# Patient Record
Sex: Female | Born: 1940
Health system: Southern US, Community
[De-identification: ages and names within clinical notes are randomized; demographics above are authoritative.]

## PROBLEM LIST (undated history)

## (undated) ENCOUNTER — Emergency Department (HOSPITAL_BASED_OUTPATIENT_CLINIC_OR_DEPARTMENT_OTHER): Admission: EM | Source: Home / Self Care

## (undated) DIAGNOSIS — L03114 Cellulitis of left upper limb: Secondary | ICD-10-CM

## (undated) DIAGNOSIS — I890 Lymphedema, not elsewhere classified: Secondary | ICD-10-CM

## (undated) DIAGNOSIS — K4190 Unilateral femoral hernia, without obstruction or gangrene, not specified as recurrent: Secondary | ICD-10-CM

## (undated) DIAGNOSIS — R112 Nausea with vomiting, unspecified: Secondary | ICD-10-CM

## (undated) DIAGNOSIS — C50919 Malignant neoplasm of unspecified site of unspecified female breast: Secondary | ICD-10-CM

## (undated) DIAGNOSIS — H409 Unspecified glaucoma: Secondary | ICD-10-CM

## (undated) DIAGNOSIS — K219 Gastro-esophageal reflux disease without esophagitis: Secondary | ICD-10-CM

## (undated) DIAGNOSIS — Z9889 Other specified postprocedural states: Secondary | ICD-10-CM

## (undated) HISTORY — DX: Unspecified glaucoma: H40.9

## (undated) HISTORY — DX: Unilateral femoral hernia, without obstruction or gangrene, not specified as recurrent: K41.90

## (undated) HISTORY — DX: Lymphedema, not elsewhere classified: I89.0

## (undated) HISTORY — DX: Malignant neoplasm of unspecified site of unspecified female breast: C50.919

## (undated) HISTORY — DX: Gastro-esophageal reflux disease without esophagitis: K21.9

## (undated) HISTORY — DX: Cellulitis of left upper limb: L03.114

---

## 1978-02-07 HISTORY — PX: BUNIONECTOMY: SHX129

## 1990-02-07 DIAGNOSIS — K4190 Unilateral femoral hernia, without obstruction or gangrene, not specified as recurrent: Secondary | ICD-10-CM

## 1990-02-07 HISTORY — DX: Unilateral femoral hernia, without obstruction or gangrene, not specified as recurrent: K41.90

## 1990-02-07 HISTORY — PX: FEMORAL HERNIA REPAIR: SHX632

## 1997-07-11 ENCOUNTER — Other Ambulatory Visit: Admission: RE | Admit: 1997-07-11 | Discharge: 1997-07-11 | Payer: Self-pay | Admitting: *Deleted

## 1998-02-03 ENCOUNTER — Ambulatory Visit (HOSPITAL_COMMUNITY): Admission: RE | Admit: 1998-02-03 | Discharge: 1998-02-03 | Payer: Self-pay | Admitting: Geriatric Medicine

## 1998-07-10 ENCOUNTER — Other Ambulatory Visit: Admission: RE | Admit: 1998-07-10 | Discharge: 1998-07-10 | Payer: Self-pay | Admitting: *Deleted

## 1999-08-27 ENCOUNTER — Other Ambulatory Visit: Admission: RE | Admit: 1999-08-27 | Discharge: 1999-08-27 | Payer: Self-pay | Admitting: *Deleted

## 2000-09-28 ENCOUNTER — Other Ambulatory Visit: Admission: RE | Admit: 2000-09-28 | Discharge: 2000-09-28 | Payer: Self-pay | Admitting: *Deleted

## 2001-02-07 DIAGNOSIS — C50919 Malignant neoplasm of unspecified site of unspecified female breast: Secondary | ICD-10-CM

## 2001-02-07 HISTORY — DX: Malignant neoplasm of unspecified site of unspecified female breast: C50.919

## 2001-09-24 ENCOUNTER — Other Ambulatory Visit: Admission: RE | Admit: 2001-09-24 | Discharge: 2001-09-24 | Payer: Self-pay | Admitting: Radiology

## 2001-10-01 ENCOUNTER — Other Ambulatory Visit: Admission: RE | Admit: 2001-10-01 | Discharge: 2001-10-01 | Payer: Self-pay | Admitting: Obstetrics and Gynecology

## 2001-10-08 HISTORY — PX: BREAST LUMPECTOMY: SHX2

## 2001-10-09 ENCOUNTER — Encounter: Admission: RE | Admit: 2001-10-09 | Discharge: 2001-10-09 | Payer: Self-pay | Admitting: General Surgery

## 2001-10-09 ENCOUNTER — Encounter: Payer: Self-pay | Admitting: General Surgery

## 2001-10-10 ENCOUNTER — Encounter (INDEPENDENT_AMBULATORY_CARE_PROVIDER_SITE_OTHER): Payer: Self-pay | Admitting: *Deleted

## 2001-10-10 ENCOUNTER — Ambulatory Visit (HOSPITAL_BASED_OUTPATIENT_CLINIC_OR_DEPARTMENT_OTHER): Admission: RE | Admit: 2001-10-10 | Discharge: 2001-10-10 | Payer: Self-pay | Admitting: General Surgery

## 2001-10-10 ENCOUNTER — Encounter: Payer: Self-pay | Admitting: General Surgery

## 2001-10-18 ENCOUNTER — Encounter (INDEPENDENT_AMBULATORY_CARE_PROVIDER_SITE_OTHER): Payer: Self-pay | Admitting: *Deleted

## 2001-10-18 ENCOUNTER — Ambulatory Visit (HOSPITAL_BASED_OUTPATIENT_CLINIC_OR_DEPARTMENT_OTHER): Admission: RE | Admit: 2001-10-18 | Discharge: 2001-10-18 | Payer: Self-pay | Admitting: General Surgery

## 2001-10-18 HISTORY — PX: AXILLARY LYMPH NODE DISSECTION: SHX5229

## 2001-10-19 ENCOUNTER — Ambulatory Visit: Admission: RE | Admit: 2001-10-19 | Discharge: 2001-11-12 | Payer: Self-pay | Admitting: *Deleted

## 2001-11-01 ENCOUNTER — Ambulatory Visit (HOSPITAL_COMMUNITY): Admission: RE | Admit: 2001-11-01 | Discharge: 2001-11-01 | Payer: Self-pay | Admitting: Oncology

## 2001-11-01 ENCOUNTER — Encounter: Payer: Self-pay | Admitting: Oncology

## 2002-03-07 ENCOUNTER — Ambulatory Visit: Admission: RE | Admit: 2002-03-07 | Discharge: 2002-06-05 | Payer: Self-pay | Admitting: *Deleted

## 2002-06-28 ENCOUNTER — Ambulatory Visit: Admission: RE | Admit: 2002-06-28 | Discharge: 2002-06-28 | Payer: Self-pay | Admitting: *Deleted

## 2002-11-05 ENCOUNTER — Other Ambulatory Visit: Admission: RE | Admit: 2002-11-05 | Discharge: 2002-11-05 | Payer: Self-pay | Admitting: Obstetrics and Gynecology

## 2002-12-05 ENCOUNTER — Ambulatory Visit (HOSPITAL_COMMUNITY): Admission: RE | Admit: 2002-12-05 | Discharge: 2002-12-05 | Payer: Self-pay | Admitting: Gastroenterology

## 2003-11-28 ENCOUNTER — Other Ambulatory Visit: Admission: RE | Admit: 2003-11-28 | Discharge: 2003-11-28 | Payer: Self-pay | Admitting: Obstetrics and Gynecology

## 2004-03-10 ENCOUNTER — Ambulatory Visit: Payer: Self-pay | Admitting: Oncology

## 2004-07-06 ENCOUNTER — Ambulatory Visit: Payer: Self-pay | Admitting: Oncology

## 2004-11-30 ENCOUNTER — Other Ambulatory Visit: Admission: RE | Admit: 2004-11-30 | Discharge: 2004-11-30 | Payer: Self-pay | Admitting: Obstetrics and Gynecology

## 2005-01-07 ENCOUNTER — Ambulatory Visit: Payer: Self-pay | Admitting: Oncology

## 2005-07-05 ENCOUNTER — Ambulatory Visit: Payer: Self-pay | Admitting: Oncology

## 2005-07-11 LAB — CBC WITH DIFFERENTIAL/PLATELET
BASO%: 0.2 % (ref 0.0–2.0)
Basophils Absolute: 0 10*3/uL (ref 0.0–0.1)
EOS%: 0.2 % (ref 0.0–7.0)
MCH: 31.8 pg (ref 26.0–34.0)
MCHC: 34.3 g/dL (ref 32.0–36.0)
MCV: 92.6 fL (ref 81.0–101.0)
MONO%: 5.9 % (ref 0.0–13.0)
RBC: 4.2 10*6/uL (ref 3.70–5.32)
RDW: 12.6 % (ref 11.3–14.5)
lymph#: 1.1 10*3/uL (ref 0.9–3.3)

## 2005-07-11 LAB — COMPREHENSIVE METABOLIC PANEL
AST: 16 U/L (ref 0–37)
BUN: 16 mg/dL (ref 6–23)
Calcium: 8.7 mg/dL (ref 8.4–10.5)
Chloride: 105 mEq/L (ref 96–112)
Creatinine, Ser: 0.95 mg/dL (ref 0.40–1.20)
Total Bilirubin: 0.4 mg/dL (ref 0.3–1.2)

## 2005-07-11 LAB — CANCER ANTIGEN 27.29: CA 27.29: 12 U/mL (ref 0–39)

## 2005-07-11 LAB — MORPHOLOGY
PLT EST: ADEQUATE
RBC Comments: NORMAL

## 2005-07-28 ENCOUNTER — Ambulatory Visit (HOSPITAL_COMMUNITY): Admission: RE | Admit: 2005-07-28 | Discharge: 2005-07-28 | Payer: Self-pay | Admitting: Oncology

## 2005-11-04 ENCOUNTER — Other Ambulatory Visit: Admission: RE | Admit: 2005-11-04 | Discharge: 2005-11-04 | Payer: Self-pay | Admitting: Obstetrics & Gynecology

## 2006-01-05 ENCOUNTER — Ambulatory Visit: Payer: Self-pay | Admitting: Oncology

## 2006-01-10 LAB — CBC WITH DIFFERENTIAL/PLATELET
BASO%: 0.3 % (ref 0.0–2.0)
EOS%: 0.8 % (ref 0.0–7.0)
HCT: 38 % (ref 34.8–46.6)
HGB: 13.1 g/dL (ref 11.6–15.9)
MCH: 32 pg (ref 26.0–34.0)
MCHC: 34.5 g/dL (ref 32.0–36.0)
MONO#: 0.3 10*3/uL (ref 0.1–0.9)
NEUT%: 77.3 % — ABNORMAL HIGH (ref 39.6–76.8)
RDW: 12.1 % (ref 11.3–14.5)
WBC: 7.9 10*3/uL (ref 3.9–10.0)
lymph#: 1.4 10*3/uL (ref 0.9–3.3)

## 2006-01-10 LAB — COMPREHENSIVE METABOLIC PANEL
ALT: 12 U/L (ref 0–35)
AST: 11 U/L (ref 0–37)
Albumin: 4.1 g/dL (ref 3.5–5.2)
CO2: 27 mEq/L (ref 19–32)
Calcium: 9.2 mg/dL (ref 8.4–10.5)
Chloride: 105 mEq/L (ref 96–112)
Creatinine, Ser: 0.88 mg/dL (ref 0.40–1.20)
Potassium: 4.2 mEq/L (ref 3.5–5.3)
Sodium: 143 mEq/L (ref 135–145)
Total Protein: 6.3 g/dL (ref 6.0–8.3)

## 2006-01-10 LAB — CANCER ANTIGEN 27.29: CA 27.29: 4 U/mL (ref 0–39)

## 2006-01-10 LAB — LACTATE DEHYDROGENASE: LDH: 163 U/L (ref 94–250)

## 2006-04-08 DIAGNOSIS — I890 Lymphedema, not elsewhere classified: Secondary | ICD-10-CM

## 2006-04-08 HISTORY — DX: Lymphedema, not elsewhere classified: I89.0

## 2006-04-18 ENCOUNTER — Encounter: Admission: RE | Admit: 2006-04-18 | Discharge: 2006-06-13 | Payer: Self-pay | Admitting: General Surgery

## 2006-07-12 ENCOUNTER — Ambulatory Visit: Payer: Self-pay | Admitting: Oncology

## 2006-07-17 LAB — CBC WITH DIFFERENTIAL/PLATELET
BASO%: 0.5 % (ref 0.0–2.0)
Basophils Absolute: 0 10*3/uL (ref 0.0–0.1)
EOS%: 0.9 % (ref 0.0–7.0)
HCT: 37.6 % (ref 34.8–46.6)
HGB: 13.3 g/dL (ref 11.6–15.9)
MCHC: 35.5 g/dL (ref 32.0–36.0)
MONO#: 0.4 10*3/uL (ref 0.1–0.9)
NEUT%: 65 % (ref 39.6–76.8)
RDW: 12.4 % (ref 11.3–14.5)
WBC: 6 10*3/uL (ref 3.9–10.0)
lymph#: 1.6 10*3/uL (ref 0.9–3.3)

## 2006-07-17 LAB — COMPREHENSIVE METABOLIC PANEL
ALT: 15 U/L (ref 0–35)
AST: 14 U/L (ref 0–37)
Albumin: 4.3 g/dL (ref 3.5–5.2)
CO2: 27 mEq/L (ref 19–32)
Calcium: 9 mg/dL (ref 8.4–10.5)
Chloride: 106 mEq/L (ref 96–112)
Creatinine, Ser: 1.03 mg/dL (ref 0.40–1.20)
Potassium: 4.1 mEq/L (ref 3.5–5.3)

## 2006-07-17 LAB — LACTATE DEHYDROGENASE: LDH: 180 U/L (ref 94–250)

## 2006-10-27 ENCOUNTER — Encounter: Admission: RE | Admit: 2006-10-27 | Discharge: 2006-10-27 | Payer: Self-pay | Admitting: Oncology

## 2007-01-11 ENCOUNTER — Ambulatory Visit: Payer: Self-pay | Admitting: Oncology

## 2007-01-15 LAB — COMPREHENSIVE METABOLIC PANEL
Alkaline Phosphatase: 49 U/L (ref 39–117)
BUN: 17 mg/dL (ref 6–23)
CO2: 25 mEq/L (ref 19–32)
Creatinine, Ser: 0.95 mg/dL (ref 0.40–1.20)
Glucose, Bld: 91 mg/dL (ref 70–99)
Sodium: 142 mEq/L (ref 135–145)
Total Bilirubin: 0.4 mg/dL (ref 0.3–1.2)
Total Protein: 6.1 g/dL (ref 6.0–8.3)

## 2007-01-15 LAB — CBC WITH DIFFERENTIAL/PLATELET
BASO%: 0.4 % (ref 0.0–2.0)
EOS%: 1.8 % (ref 0.0–7.0)
LYMPH%: 25.6 % (ref 14.0–48.0)
MCH: 32.2 pg (ref 26.0–34.0)
MCHC: 35.2 g/dL (ref 32.0–36.0)
MCV: 91.5 fL (ref 81.0–101.0)
MONO%: 5.3 % (ref 0.0–13.0)
NEUT#: 4.1 10*3/uL (ref 1.5–6.5)
Platelets: 218 10*3/uL (ref 145–400)
RBC: 4.14 10*6/uL (ref 3.70–5.32)
RDW: 12.4 % (ref 11.3–14.5)

## 2007-01-15 LAB — LACTATE DEHYDROGENASE: LDH: 174 U/L (ref 94–250)

## 2007-01-15 LAB — CANCER ANTIGEN 27.29: CA 27.29: 11 U/mL (ref 0–39)

## 2007-05-09 ENCOUNTER — Encounter: Admission: RE | Admit: 2007-05-09 | Discharge: 2007-05-09 | Payer: Self-pay | Admitting: Oncology

## 2007-07-12 ENCOUNTER — Ambulatory Visit: Payer: Self-pay | Admitting: Oncology

## 2007-07-16 LAB — CBC WITH DIFFERENTIAL/PLATELET
Basophils Absolute: 0 10*3/uL (ref 0.0–0.1)
Eosinophils Absolute: 0 10*3/uL (ref 0.0–0.5)
HCT: 38.5 % (ref 34.8–46.6)
HGB: 13.4 g/dL (ref 11.6–15.9)
LYMPH%: 21.3 % (ref 14.0–48.0)
MCV: 91.4 fL (ref 81.0–101.0)
MONO%: 6.2 % (ref 0.0–13.0)
NEUT#: 4.5 10*3/uL (ref 1.5–6.5)
Platelets: 232 10*3/uL (ref 145–400)

## 2007-07-17 LAB — COMPREHENSIVE METABOLIC PANEL
Albumin: 3.9 g/dL (ref 3.5–5.2)
Alkaline Phosphatase: 47 U/L (ref 39–117)
BUN: 15 mg/dL (ref 6–23)
CO2: 26 mEq/L (ref 19–32)
Glucose, Bld: 93 mg/dL (ref 70–99)
Total Bilirubin: 0.4 mg/dL (ref 0.3–1.2)

## 2007-07-17 LAB — VITAMIN D 25 HYDROXY (VIT D DEFICIENCY, FRACTURES): Vit D, 25-Hydroxy: 37 ng/mL (ref 30–89)

## 2007-07-17 LAB — CANCER ANTIGEN 27.29: CA 27.29: 12 U/mL (ref 0–39)

## 2008-01-01 ENCOUNTER — Other Ambulatory Visit: Admission: RE | Admit: 2008-01-01 | Discharge: 2008-01-01 | Payer: Self-pay | Admitting: Obstetrics and Gynecology

## 2008-01-17 ENCOUNTER — Ambulatory Visit: Payer: Self-pay | Admitting: Oncology

## 2008-01-21 LAB — CBC WITH DIFFERENTIAL/PLATELET
BASO%: 0.4 % (ref 0.0–2.0)
EOS%: 0.6 % (ref 0.0–7.0)
Eosinophils Absolute: 0 10*3/uL (ref 0.0–0.5)
LYMPH%: 17.7 % (ref 14.0–48.0)
MCHC: 34.3 g/dL (ref 32.0–36.0)
MCV: 92.7 fL (ref 81.0–101.0)
MONO%: 5.2 % (ref 0.0–13.0)
NEUT#: 4.8 10*3/uL (ref 1.5–6.5)
RBC: 4.11 10*6/uL (ref 3.70–5.32)
RDW: 12.6 % (ref 11.3–14.5)
WBC: 6.4 10*3/uL (ref 3.9–10.0)

## 2008-01-22 LAB — COMPREHENSIVE METABOLIC PANEL
ALT: 12 U/L (ref 0–35)
AST: 12 U/L (ref 0–37)
Alkaline Phosphatase: 47 U/L (ref 39–117)
CO2: 24 mEq/L (ref 19–32)
Sodium: 142 mEq/L (ref 135–145)
Total Bilirubin: 0.4 mg/dL (ref 0.3–1.2)
Total Protein: 6.3 g/dL (ref 6.0–8.3)

## 2009-01-16 ENCOUNTER — Ambulatory Visit: Payer: Self-pay | Admitting: Oncology

## 2009-01-20 LAB — COMPREHENSIVE METABOLIC PANEL
CO2: 28 mEq/L (ref 19–32)
Calcium: 8.9 mg/dL (ref 8.4–10.5)
Chloride: 105 mEq/L (ref 96–112)
Creatinine, Ser: 0.89 mg/dL (ref 0.40–1.20)
Glucose, Bld: 79 mg/dL (ref 70–99)
Total Bilirubin: 0.4 mg/dL (ref 0.3–1.2)
Total Protein: 6 g/dL (ref 6.0–8.3)

## 2009-01-20 LAB — LACTATE DEHYDROGENASE: LDH: 146 U/L (ref 94–250)

## 2009-01-20 LAB — CBC WITH DIFFERENTIAL/PLATELET
Eosinophils Absolute: 0.1 10*3/uL (ref 0.0–0.5)
HCT: 38.7 % (ref 34.8–46.6)
HGB: 13.3 g/dL (ref 11.6–15.9)
LYMPH%: 25.5 % (ref 14.0–49.7)
MONO#: 0.3 10*3/uL (ref 0.1–0.9)
NEUT#: 2.8 10*3/uL (ref 1.5–6.5)
NEUT%: 64.8 % (ref 38.4–76.8)
Platelets: 201 10*3/uL (ref 145–400)
WBC: 4.4 10*3/uL (ref 3.9–10.3)

## 2009-01-20 LAB — CANCER ANTIGEN 27.29: CA 27.29: 5 U/mL (ref 0–39)

## 2010-01-14 ENCOUNTER — Ambulatory Visit: Payer: Self-pay | Admitting: Oncology

## 2010-01-18 LAB — CBC WITH DIFFERENTIAL/PLATELET
Basophils Absolute: 0 10*3/uL (ref 0.0–0.1)
Eosinophils Absolute: 0.1 10*3/uL (ref 0.0–0.5)
HCT: 38.8 % (ref 34.8–46.6)
HGB: 13.5 g/dL (ref 11.6–15.9)
LYMPH%: 21.8 % (ref 14.0–49.7)
MCV: 93.7 fL (ref 79.5–101.0)
MONO%: 6 % (ref 0.0–14.0)
NEUT#: 4.4 10*3/uL (ref 1.5–6.5)
NEUT%: 69.8 % (ref 38.4–76.8)
Platelets: 218 10*3/uL (ref 145–400)

## 2010-01-19 LAB — COMPREHENSIVE METABOLIC PANEL
Albumin: 4.1 g/dL (ref 3.5–5.2)
Alkaline Phosphatase: 57 U/L (ref 39–117)
BUN: 15 mg/dL (ref 6–23)
Glucose, Bld: 75 mg/dL (ref 70–99)
Potassium: 3.9 mEq/L (ref 3.5–5.3)

## 2010-01-19 LAB — VITAMIN D 25 HYDROXY (VIT D DEFICIENCY, FRACTURES): Vit D, 25-Hydroxy: 42 ng/mL (ref 30–89)

## 2010-01-19 LAB — CANCER ANTIGEN 27.29: CA 27.29: 11 U/mL (ref 0–39)

## 2010-02-28 ENCOUNTER — Encounter: Payer: Self-pay | Admitting: Oncology

## 2010-03-01 ENCOUNTER — Encounter: Payer: Self-pay | Admitting: Oncology

## 2010-06-25 NOTE — Op Note (Signed)
   NAMEERMAL, BRZOZOWSKI                           ACCOUNT NO.:  [id number]   MEDICAL RECORD NO.:  [id number]                   PATIENT TYPE:  AMB   LOCATION:  ENDO                                 FACILITY:  [facility]   PHYSICIAN:  John C. Madilyn Fireman, M.D.                 DATE OF BIRTH:  Jan 12, 1941   DATE OF PROCEDURE:  12/05/2002  DATE OF DISCHARGE:                                 OPERATIVE REPORT   PROCEDURE:  Colonoscopy.   INDICATION FOR PROCEDURE:  Personal history of breast cancer in a 70-year-  old patient with no recent colon screening.   DESCRIPTION OF PROCEDURE:  The patient was placed in the left lateral  decubitus position and placed on the pulse monitor with continuous low-flow  oxygen delivered by nasal cannula.  She was sedated with 75 mcg of IV  fentanyl and 6 mg IV Versed.  The Olympus video colonoscope was inserted  into the rectum and advanced to the cecum, confirmed by transillumination of  McBurney's point and visualization of the ileocecal valve and appendiceal  orifice.  The prep was excellent.  The cecum, ascending, transverse, and  descending colon all appeared normal with no masses, polyps, diverticula, or  other mucosal abnormalities.  Within the sigmoid colon there were seen a few  scattered diverticula, no other abnormalities.  The rectum appeared normal,  and retroflexed view of the anus revealed no obvious internal hemorrhoids.  The scope was then withdrawn and the patient returned to the recovery room  in stable condition.  She tolerated the procedure well and there were no  immediate complications.   IMPRESSION:  Diverticulosis, otherwise normal colonoscopy.   PLAN:  Repeat study in five years based on her history of breast cancer.                                               John C. Madilyn Fireman, M.D.    JCH/MEDQ  D:  12/05/2002  T:  12/05/2002  Job:  [id number]   cc:   Hal T. Stoneking, M.D.  [address] [place], [place] [address]  Fax: [phone]

## 2010-06-25 NOTE — Op Note (Signed)
NAME:  Megan Graham, EBERS                           ACCOUNT NO.:  [id number]   MEDICAL RECORD NO.:  [id number]                   PATIENT TYPE:  AMB   LOCATION:  DSC                                  FACILITY:  [facility]   PHYSICIAN:  Rose Phi. Maple Hudson, M.D.                DATE OF BIRTH:  September 17, 1940   DATE OF PROCEDURE:  10/10/2001  DATE OF DISCHARGE:                                 OPERATIVE REPORT   PREOPERATIVE DIAGNOSIS:  Stage I carcinoma of the right breast.   POSTOPERATIVE DIAGNOSIS:  Stage I carcinoma of the right breast.   PROCEDURES:  1. Blue dye injection.  2. Right axillary sentinel lymph node biopsy.  3. Right partial mastectomy.   SURGEON:  Rose Phi. Maple Hudson, M.D.   ANESTHESIA:  General.   DESCRIPTION OF PROCEDURE:  Prior to coming to the operating room, the  patient had 1 mCi of Technetium sulfur colloid injected intradermally in the  periareolar area.  After anesthesia had been induced, we injected 4 cc of  Lymphazurin blue in the subareolar tissue to help identify sentinel nodes.   After suitable general anesthesia was induced and the blue dye injected, the  patient was placed in the supine position with her right arm extended on the  arm board.  The right breast, axilla, and shoulder were prepped and draped  in the usual fashion.  Scanning of the axilla revealed a very hot spot, and  we made a short transverse axillary incision with dissection through the  subcutaneous tissue to the clavipectoral fascia.  Two hot and blue lymph  nodes were thoroughly identified and removed, clipping the afferent and  efferent lymphatics.  They were separated and submitted as two sentinel  nodes.  Following their removal, there were no other hot spots, blue nodes,  or palpable adenopathy.   While the sentinel nodes were being evaluated, a curved incision including  an ellipse of skin over the palpable tumor was then outlined, which was in  the upper outer quadrant of the right breast.  A  wide excision of the  palpable tumor was carried out.  The specimen was oriented for the  pathologist.   Touch preps of the nodes were negative, and touch preps of the margins were  clear.   Incisions were closed with 3-0 Vicryl and subcuticular 4-0 Monocryl and  Steri-Strips.  Dressings applied.  The patient transferred to the recovery  room in satisfactory condition, having tolerated the procedure well.                                                Rose Phi. Maple Hudson, M.D.    PRY/MEDQ  D:  10/10/2001  T:  10/11/2001  Job:  [id number]   cc:   Ann Maki  Dierdre Searles, M.D.  [address] [place], [place] [address]  Fax: [phone]

## 2010-06-25 NOTE — Op Note (Signed)
   NAME:  Megan, Graham                           ACCOUNT NO.:  [id number]   MEDICAL RECORD NO.:  [id number]                   PATIENT TYPE:  AMB   LOCATION:  DSC                                  FACILITY:  [facility]   PHYSICIAN:  Rose Phi. Maple Hudson, M.D.                DATE OF BIRTH:  [birth year]   DATE OF PROCEDURE:  10/18/2001  DATE OF DISCHARGE:                                 OPERATIVE REPORT   PREOPERATIVE DIAGNOSIS:  Stage II carcinoma of the right breast with a  positive sentinel node.   POSTOPERATIVE DIAGNOSIS:  Stage II carcinoma of the right breast with a  positive sentinel node.   PROCEDURE:  Right axillary lymph node dissection.   SURGEON:  Rose Phi. Maple Hudson, M.D.   ANESTHESIA:  General.   DESCRIPTION OF PROCEDURE:  After suitable general anesthesia was induced,  the patient was placed in a supine position with the right arm extended on  the arm board.  The right breast and axilla were prepped and draped in the  standard fashion.   We then opened up and extended medially and laterally the previous sentinel  node biopsy incision in the right axilla.  I then exposed the pectoralis  major muscle and dissected along the pectoralis major muscle and exposed the  pectoralis minor and dissected along this up and then identified the  clavipectoral fascia over the axillary vein.  We incised this and then  dissected out all the tissue inferior to the vein and from deep to the  pectoralis minor.  The long thoracic and thoracodorsal nerves were clearly  identified and preserved.  Other vessels and nerves were clipped and  divided.  Following removal of axillary contents, we thoroughly irrigated  the field with saline.  We had good hemostasis.  A 19 [place] Blake drain was  inserted and brought out through a separate stab wound.  The closure was  carried out with subcutaneous 3-0 Vicryl and a subcuticular 4-0 Monocryl  with Steri-Strips.  Dressings were applied.  Patient transferred to the  recovery room in satisfactory condition, having tolerated the procedure  well.                                               Rose Phi. Maple Hudson, M.D.    PRY/MEDQ  D:  10/18/2001  T:  10/19/2001  Job:  [id number]

## 2011-01-17 ENCOUNTER — Other Ambulatory Visit: Payer: Self-pay | Admitting: Oncology

## 2011-01-17 ENCOUNTER — Other Ambulatory Visit (HOSPITAL_BASED_OUTPATIENT_CLINIC_OR_DEPARTMENT_OTHER): Payer: Medicare Other | Admitting: Lab

## 2011-01-17 DIAGNOSIS — C50419 Malignant neoplasm of upper-outer quadrant of unspecified female breast: Secondary | ICD-10-CM

## 2011-01-17 DIAGNOSIS — M810 Age-related osteoporosis without current pathological fracture: Secondary | ICD-10-CM

## 2011-01-17 LAB — CBC WITH DIFFERENTIAL/PLATELET
Basophils Absolute: 0 10*3/uL (ref 0.0–0.1)
EOS%: 0.9 % (ref 0.0–7.0)
Eosinophils Absolute: 0 10*3/uL (ref 0.0–0.5)
HCT: 39.7 % (ref 34.8–46.6)
HGB: 13.6 g/dL (ref 11.6–15.9)
MONO#: 0.3 10*3/uL (ref 0.1–0.9)
NEUT#: 2.4 10*3/uL (ref 1.5–6.5)
NEUT%: 58.2 % (ref 38.4–76.8)
RDW: 12.4 % (ref 11.2–14.5)
WBC: 4 10*3/uL (ref 3.9–10.3)
lymph#: 1.3 10*3/uL (ref 0.9–3.3)

## 2011-01-18 LAB — COMPREHENSIVE METABOLIC PANEL
AST: 20 U/L (ref 0–37)
Albumin: 3.9 g/dL (ref 3.5–5.2)
BUN: 13 mg/dL (ref 6–23)
CO2: 29 mEq/L (ref 19–32)
Calcium: 9.2 mg/dL (ref 8.4–10.5)
Chloride: 102 mEq/L (ref 96–112)
Creatinine, Ser: 0.88 mg/dL (ref 0.50–1.10)
Potassium: 4.2 mEq/L (ref 3.5–5.3)

## 2011-01-25 ENCOUNTER — Telehealth: Payer: Self-pay | Admitting: *Deleted

## 2011-01-25 ENCOUNTER — Ambulatory Visit (HOSPITAL_BASED_OUTPATIENT_CLINIC_OR_DEPARTMENT_OTHER): Payer: Medicare Other | Admitting: Oncology

## 2011-01-25 ENCOUNTER — Other Ambulatory Visit: Payer: Self-pay | Admitting: Oncology

## 2011-01-25 VITALS — BP 117/71 | HR 76 | Temp 98.7°F | Wt 142.2 lb

## 2011-01-25 DIAGNOSIS — E559 Vitamin D deficiency, unspecified: Secondary | ICD-10-CM

## 2011-01-25 DIAGNOSIS — Z923 Personal history of irradiation: Secondary | ICD-10-CM

## 2011-01-25 DIAGNOSIS — Z853 Personal history of malignant neoplasm of breast: Secondary | ICD-10-CM

## 2011-01-25 DIAGNOSIS — C50919 Malignant neoplasm of unspecified site of unspecified female breast: Secondary | ICD-10-CM

## 2011-01-25 NOTE — Telephone Encounter (Signed)
gave patient appointment for 01-2012 printed out calendar and gave to the patient 

## 2011-01-25 NOTE — Progress Notes (Signed)
Hematology and Oncology Follow Up Visit  Megan Graham [id number] 10/31/1940 70 y.o. 01/25/2011 11:29 AM PCP  Principle Diagnosis: 70 yo hx stage 2 breast cancer s/p CMF x6 , completed 02/26/02; xrt completed 06/06/02, 5 yr of arimidex, completed in 2009. On annual f/u.  Interim History:  There have been no intercurrent illness, hospitalizations or medication changes. Low pressure glaucoma  Medications: I have reviewed the patient's current medications.  Allergies: No Known Allergies  Past Medical History, Surgical history, Social history, and Family History were reviewed and updated.  Review of Systems: Constitutional:  Negative for fever, chills, night sweats, anorexia, weight loss, pain. Cardiovascular: no chest pain or dyspnea on exertion Respiratory: no cough, shortness of breath, or wheezing Neurological: no TIA or stroke symptoms Dermatological: negative ENT: negative Skin Gastrointestinal: no abdominal pain, change in bowel habits, or black or bloody stools Genito-Urinary: no dysuria, trouble voiding, or hematuria Hematological and Lymphatic: negative Breast: negative for breast lumps Musculoskeletal: negative Remaining ROS negative.  Physical Exam: Blood pressure 117/71, pulse 76, temperature 98.7 F (37.1 C), weight 142 lb 3.2 oz (64.501 kg). ECOG: 0 General appearance: alert, cooperative and appears stated age Head: Normocephalic, without obvious abnormality, atraumatic Neck: no adenopathy, no carotid bruit, no JVD, supple, symmetrical, trachea midline and thyroid not enlarged, symmetric, no tenderness/mass/nodules Lymph nodes: Cervical, supraclavicular, and axillary nodes normal. Cardiac : regular rate and rhythm, no murmurs or gallops Pulmonary:clear to auscultation bilaterally and normal percussion bilaterally Breasts: inspection negative, no nipple discharge or bleeding, no masses or nodularity palpable Abdomen:soft, non-tender; bowel sounds normal; no masses,   no organomegaly Extremities negative Neuro: alert, oriented, normal speech, no focal findings or movement disorder noted  Lab Results: Lab Results  Component Value Date   WBC 4.0 01/17/2011   HGB 13.6 01/17/2011   HCT 39.7 01/17/2011   MCV 92.6 01/17/2011   PLT 166 01/17/2011     Chemistry      Component Value Date/Time   NA 138 01/17/2011 1312   NA 138 01/17/2011 1312   NA 138 01/17/2011 1312   K 4.2 01/17/2011 1312   K 4.2 01/17/2011 1312   K 4.2 01/17/2011 1312   CL 102 01/17/2011 1312   CL 102 01/17/2011 1312   CL 102 01/17/2011 1312   CO2 29 01/17/2011 1312   CO2 29 01/17/2011 1312   CO2 29 01/17/2011 1312   BUN 13 01/17/2011 1312   BUN 13 01/17/2011 1312   BUN 13 01/17/2011 1312   CREATININE 0.88 01/17/2011 1312   CREATININE 0.88 01/17/2011 1312   CREATININE 0.88 01/17/2011 1312      Component Value Date/Time   CALCIUM 9.2 01/17/2011 1312   CALCIUM 9.2 01/17/2011 1312   CALCIUM 9.2 01/17/2011 1312   ALKPHOS 60 01/17/2011 1312   ALKPHOS 60 01/17/2011 1312   ALKPHOS 60 01/17/2011 1312   AST 20 01/17/2011 1312   AST 20 01/17/2011 1312   AST 20 01/17/2011 1312   ALT 23 01/17/2011 1312   ALT 23 01/17/2011 1312   ALT 23 01/17/2011 1312   BILITOT 0.3 01/17/2011 1312   BILITOT 0.3 01/17/2011 1312   BILITOT 0.3 01/17/2011 1312      .pathology. Radiological Studies: chest X-ray n/a Mammogram 10/12-nl Bone density 8/12- nl  Impression and Plan: 70 hx breast cancer  Diagnosed 9 y ago.. Clinically NED; f/u in 1 yr.  More than 50% of the visit was spent in patient-related counselling   Pierce Crane, MD 12/18/201211:29 AM

## 2012-01-25 ENCOUNTER — Other Ambulatory Visit: Payer: Medicare Other | Admitting: Lab

## 2012-01-30 ENCOUNTER — Other Ambulatory Visit (HOSPITAL_BASED_OUTPATIENT_CLINIC_OR_DEPARTMENT_OTHER): Payer: Medicare Other | Admitting: Lab

## 2012-01-30 DIAGNOSIS — C50919 Malignant neoplasm of unspecified site of unspecified female breast: Secondary | ICD-10-CM

## 2012-01-30 DIAGNOSIS — E559 Vitamin D deficiency, unspecified: Secondary | ICD-10-CM

## 2012-01-30 LAB — CBC WITH DIFFERENTIAL/PLATELET
Basophils Absolute: 0 10*3/uL (ref 0.0–0.1)
Eosinophils Absolute: 0.1 10*3/uL (ref 0.0–0.5)
HCT: 39.2 % (ref 34.8–46.6)
HGB: 13.9 g/dL (ref 11.6–15.9)
LYMPH%: 25.8 % (ref 14.0–49.7)
MCHC: 35.4 g/dL (ref 31.5–36.0)
MONO#: 0.4 10*3/uL (ref 0.1–0.9)
NEUT#: 3.3 10*3/uL (ref 1.5–6.5)
NEUT%: 64.6 % (ref 38.4–76.8)
Platelets: 199 10*3/uL (ref 145–400)
WBC: 5.1 10*3/uL (ref 3.9–10.3)
lymph#: 1.3 10*3/uL (ref 0.9–3.3)

## 2012-01-30 LAB — COMPREHENSIVE METABOLIC PANEL (CC13)
AST: 14 U/L (ref 5–34)
Albumin: 3.5 g/dL (ref 3.5–5.0)
BUN: 13 mg/dL (ref 7.0–26.0)
CO2: 29 mEq/L (ref 22–29)
Calcium: 8.8 mg/dL (ref 8.4–10.4)
Chloride: 104 mEq/L (ref 98–107)
Creatinine: 0.9 mg/dL (ref 0.6–1.1)
Glucose: 93 mg/dl (ref 70–99)
Potassium: 4.3 mEq/L (ref 3.5–5.1)

## 2012-02-07 ENCOUNTER — Ambulatory Visit (HOSPITAL_BASED_OUTPATIENT_CLINIC_OR_DEPARTMENT_OTHER): Payer: Medicare Other | Admitting: Oncology

## 2012-02-07 VITALS — BP 132/78 | HR 97 | Temp 98.3°F | Resp 20 | Ht 66.5 in | Wt 146.3 lb

## 2012-02-07 DIAGNOSIS — C50919 Malignant neoplasm of unspecified site of unspecified female breast: Secondary | ICD-10-CM

## 2012-02-07 DIAGNOSIS — Z853 Personal history of malignant neoplasm of breast: Secondary | ICD-10-CM

## 2012-02-07 NOTE — Progress Notes (Signed)
Hematology and Oncology Follow Up Visit  Megan Graham [id number] Aug 22, 1940 71 y.o. 02/07/2012 9:49 AM PCP Dr Pete Glatter  Principle Diagnosis: 71 yo hx stage 2 breast cancer s/p CMF x6 , completed 02/26/02; xrt completed 06/06/02, 5 yr of arimidex, completed in 2009. On annual f/u.  Interim History:  There have been no intercurrent illness, hospitalizations or medication changes. Low pressure glaucoma on multiple drops as well as  Additional vitamin D.  Medications: I have reviewed the patient's current medications.  Allergies: No Known Allergies  Past Medical History, Surgical history, Social history, and Family History were reviewed and updated.  Review of Systems: Constitutional:  Negative for fever, chills, night sweats, anorexia, weight loss, pain. Cardiovascular: no chest pain or dyspnea on exertion Respiratory: no cough, shortness of breath, or wheezing Neurological: no TIA or stroke symptoms Dermatological: negative ENT: negative Skin Gastrointestinal: no abdominal pain, change in bowel habits, or black or bloody stools Genito-Urinary: no dysuria, trouble voiding, or hematuria Hematological and Lymphatic: negative Breast: negative for breast lumps Musculoskeletal: negative Remaining ROS negative.  Physical Exam: Blood pressure 132/78, pulse 97, temperature 98.3 F (36.8 C), resp. rate 20, height 5' 6.5" (1.689 m), weight 146 lb 4.8 oz (66.361 kg). ECOG: 0 General appearance: alert, cooperative and appears stated age Head: Normocephalic, without obvious abnormality, atraumatic Neck: no adenopathy, no carotid bruit, no JVD, supple, symmetrical, trachea midline and thyroid not enlarged, symmetric, no tenderness/mass/nodules Lymph nodes: Cervical, supraclavicular, and axillary nodes normal. Cardiac : regular rate and rhythm, no murmurs or gallops Pulmonary:clear to auscultation bilaterally and normal percussion bilaterally Breasts: inspection negative, no nipple discharge or  bleeding, no masses or nodularity palpable Abdomen:soft, non-tender; bowel sounds normal; no masses,  no organomegaly Extremities negative Neuro: alert, oriented, normal speech, no focal findings or movement disorder noted  Lab Results: Lab Results  Component Value Date   WBC 5.1 01/30/2012   HGB 13.9 01/30/2012   HCT 39.2 01/30/2012   MCV 92.6 01/30/2012   PLT 199 01/30/2012     Chemistry      Component Value Date/Time   NA 142 01/30/2012 0842   NA 138 01/17/2011 1312   NA 138 01/17/2011 1312   NA 138 01/17/2011 1312   K 4.3 01/30/2012 0842   K 4.2 01/17/2011 1312   K 4.2 01/17/2011 1312   K 4.2 01/17/2011 1312   CL 104 01/30/2012 0842   CL 102 01/17/2011 1312   CL 102 01/17/2011 1312   CL 102 01/17/2011 1312   CO2 29 01/30/2012 0842   CO2 29 01/17/2011 1312   CO2 29 01/17/2011 1312   CO2 29 01/17/2011 1312   BUN 13.0 01/30/2012 0842   BUN 13 01/17/2011 1312   BUN 13 01/17/2011 1312   BUN 13 01/17/2011 1312   CREATININE 0.9 01/30/2012 0842   CREATININE 0.88 01/17/2011 1312   CREATININE 0.88 01/17/2011 1312   CREATININE 0.88 01/17/2011 1312      Component Value Date/Time   CALCIUM 8.8 01/30/2012 0842   CALCIUM 9.2 01/17/2011 1312   CALCIUM 9.2 01/17/2011 1312   CALCIUM 9.2 01/17/2011 1312   ALKPHOS 58 01/30/2012 0842   ALKPHOS 60 01/17/2011 1312   ALKPHOS 60 01/17/2011 1312   ALKPHOS 60 01/17/2011 1312   AST 14 01/30/2012 0842   AST 20 01/17/2011 1312   AST 20 01/17/2011 1312   AST 20 01/17/2011 1312   ALT 16 01/30/2012 0842   ALT 23 01/17/2011 1312   ALT 23 01/17/2011 1312  ALT 23 01/17/2011 1312   BILITOT 0.53 01/30/2012 0842   BILITOT 0.3 01/17/2011 1312   BILITOT 0.3 01/17/2011 1312   BILITOT 0.3 01/17/2011 1312      .pathology. Radiological Studies: chest X-ray n/a Mammogram 10/13 wnl Bone density 8/12- nl  Impression and Plan: 70 hx breast cancer  Diagnosed 10 y ago.. Clinically NED;; we discussed d/c from our practice and f/u with  primary care for routine cancer screening. She knows she can call our office if the need arises. She will be due for  A bone density in 2014. More than 50% of the visit was spent in patient-related counselling   Pierce Crane, MD 12/31/20139:49 AM

## 2012-11-26 ENCOUNTER — Ambulatory Visit (INDEPENDENT_AMBULATORY_CARE_PROVIDER_SITE_OTHER): Payer: Medicare Other | Admitting: Gynecology

## 2012-11-26 ENCOUNTER — Ambulatory Visit: Payer: Self-pay | Admitting: Obstetrics and Gynecology

## 2012-11-26 ENCOUNTER — Encounter: Payer: Self-pay | Admitting: Gynecology

## 2012-11-26 DIAGNOSIS — Z01419 Encounter for gynecological examination (general) (routine) without abnormal findings: Secondary | ICD-10-CM

## 2012-11-26 DIAGNOSIS — Z853 Personal history of malignant neoplasm of breast: Secondary | ICD-10-CM | POA: Insufficient documentation

## 2012-11-26 NOTE — Progress Notes (Signed)
72 y.o. Married Caucasian female  G0  here for annual exam. Pt reports menses are absent.  She does not report hot flashes, does not have night sweats, does not have vaginal dryness.  She is not using lubricants,.  She does not report post-menopasual bleeding.  Pt is not sexually active, breast cancer 11y ago.    No LMP recorded.          Sexually active: no  The current method of family planning is post menopausal status.    Exercising: yes  aerobics, strength, balance 3x/wk Last pap: 11/11/2009 Abnormal PAP: no Mammogram: 11/21/11 BI-Rads 2  BSE: yes  Colonoscopy: 2009, every 5y DEXA: 2010 Alcohol: no Tobacco: no  Hgb: PCP ; Urine: PCP  Health Maintenance  Topic Date Due  . Tetanus/tdap  10/16/1958  . Colonoscopy  10/15/1989  . Zostavax  10/16/1999  . Pneumococcal Polysaccharide Vaccine Age 68 And Over  10/15/2004  . Influenza Vaccine  09/07/2012    No family history on file.  There are no active problems to display for this patient.   No past medical history on file.  No past surgical history on file.  Allergies: Codeine and Sulfa antibiotics  Current Outpatient Prescriptions  Medication Sig Dispense Refill  . Artificial Tear Ointment (DRY EYES OP) Apply to eye.      . calcium-vitamin D (OSCAL) 250-125 MG-UNIT per tablet Take 1 tablet by mouth daily.      . cholecalciferol (VITAMIN D) 1000 UNITS tablet Take 2,000 Units by mouth daily.       . Coenzyme Q10 (CO Q 10 PO) Take 100 mg by mouth daily.      Marland Kitchen latanoprost (XALATAN) 0.005 % ophthalmic solution 1 drop at bedtime.        . Red Yeast Rice 600 MG CAPS Take by mouth.      . esomeprazole (NEXIUM) 40 MG capsule Take 40 mg by mouth as needed.         No current facility-administered medications for this visit.    ROS: Pertinent items are noted in HPI.  Exam:    There were no vitals taken for this visit. Weight change: @WEIGHTCHANGE @ Last 3 height recordings:  Ht Readings from Last 3 Encounters:  02/07/12 5'  6.5" (1.689 m)   General appearance: alert, cooperative and appears stated age Head: Normocephalic, without obvious abnormality, atraumatic Neck: no adenopathy, no carotid bruit, no JVD, supple, symmetrical, trachea midline and thyroid not enlarged, symmetric, no tenderness/mass/nodules Lungs: clear to auscultation bilaterally Breasts: normal appearance, no masses or tenderness, dense Heart: regular rate and rhythm, S1, S2 normal, no murmur, click, rub or gallop Abdomen: soft, non-tender; bowel sounds normal; no masses,  no organomegaly Extremities: extremities normal, atraumatic, no cyanosis or edema Skin: Skin color, texture, turgor normal. No rashes or lesions Lymph nodes: Cervical, supraclavicular, and axillary nodes normal. no inguinal nodes palpated Neurologic: Grossly normal   Pelvic: External genitalia:  no lesions              Urethra: normal appearing urethra with no masses, tenderness or lesions              Bartholins and Skenes: normal                 Vagina: atrophic              Cervix: atrophic              Pap taken: no  Bimanual Exam:  Uterus:  Atrophic, mobile                                      Adnexa:    no masses                                      Rectovaginal: Confirms                                      Anus:  normal sphincter tone, no lesions  A: well woman      P: mammogram annual pap smear guidelines reviewed Colonoscopy due counseled on breast self exam, mammography screening, menopause, adequate intake of calcium and vitamin D, diet and exercise return annually or prn Discussed PAP guideline changes, importance of weight bearing exercises, calcium, vit D and balanced diet.  An After Visit Summary was printed and given to the patient.

## 2012-11-26 NOTE — Patient Instructions (Signed)

## 2012-11-27 ENCOUNTER — Encounter: Payer: Self-pay | Admitting: Gynecology

## 2012-12-11 ENCOUNTER — Telehealth: Payer: Self-pay | Admitting: Emergency Medicine

## 2012-12-11 NOTE — Telephone Encounter (Signed)
Calling patient to advise of DEXA done 10/21. Not at home per female who answered phone. Will try again.   Per Dr. Farrel Gobble, Dexa is stable and can follow up in 2 years.

## 2012-12-13 NOTE — Telephone Encounter (Signed)
Message left to return call to Toini Failla at [phone].    

## 2012-12-14 NOTE — Telephone Encounter (Signed)
Pt returning call. Will not be home until 11:45.

## 2012-12-14 NOTE — Telephone Encounter (Signed)
Spoke with patinet and message from Dr. Farrel Gobble given. Verbalized understanding and will call for follow up prn.

## 2013-02-11 ENCOUNTER — Telehealth: Payer: Self-pay | Admitting: Gynecology

## 2013-02-11 NOTE — Telephone Encounter (Signed)
Pt is having some lower abdomen pressure. Please call to schedule an appointment.

## 2013-02-11 NOTE — Telephone Encounter (Signed)
Spoke with pt who describes some lower abdominal pressure that started Friday night. Pt notices it when she is seated and when she stands, and sometimes when she walks. Denies any urinary symptoms. Scheduled OV with TL tomorrow at 11:00 per pt request.

## 2013-02-12 ENCOUNTER — Encounter: Payer: Self-pay | Admitting: Gynecology

## 2013-02-12 ENCOUNTER — Ambulatory Visit (INDEPENDENT_AMBULATORY_CARE_PROVIDER_SITE_OTHER): Payer: Medicare Other | Admitting: Gynecology

## 2013-02-12 VITALS — BP 120/60 | HR 80 | Resp 16 | Ht 66.5 in | Wt 144.0 lb

## 2013-02-12 DIAGNOSIS — R1032 Left lower quadrant pain: Secondary | ICD-10-CM

## 2013-02-12 LAB — POCT URINALYSIS DIPSTICK
BILIRUBIN UA: NEGATIVE
Blood, UA: NEGATIVE
GLUCOSE UA: NEGATIVE
KETONES UA: NEGATIVE
Leukocytes, UA: NEGATIVE
Nitrite, UA: NEGATIVE
Protein, UA: NEGATIVE
UROBILINOGEN UA: NEGATIVE
pH, UA: 8

## 2013-02-12 NOTE — Progress Notes (Signed)
Subjective:     Patient ID: Megan Graham, female   DOB: 01/14/1941, 73 y.o.   MRN: [id number]  HPI Comments: Acute onset of suprapubic pain that began 4d ago.  Pt reports that it is worse with change in position. No fever or chills. Pt reports cellulitis of right arm and was on keflex for 15d, completed 12/26.  Pt denies vaginal discharge, GI upset.   Pt reports recent yoga and balance class  Abdominal Cramping This is a new problem. The current episode started in the past 7 days. The onset quality is sudden. The abdominal pain radiates to the suprapubic region. Pertinent negatives include no dysuria, fever or hematuria.     Review of Systems  Constitutional: Negative for fever.  Genitourinary: Negative for dysuria and hematuria.       Objective:   Physical Exam  Constitutional: She appears well-developed and well-nourished.  Abdominal: Soft. Bowel sounds are normal. She exhibits no distension and no mass. There is no tenderness. There is no rebound and no guarding.  Genitourinary: Vagina normal and uterus normal. There is no rash or tenderness on the right labia. There is no rash or tenderness on the left labia. Cervix exhibits no discharge. Right adnexum displays no mass and no tenderness. Left adnexum displays no mass and no tenderness.  Lymphadenopathy:       Right: No inguinal adenopathy present.       Left: No inguinal adenopathy present.   Atrophic changes No tenderness under bladder neck    Assessment:     Nonspecific suprapubic pain     Plan:     Urine culture if negative and sx persist refer for GI evaluation  Can be related to recent exercise change-can try NSAIDS

## 2013-02-12 NOTE — Patient Instructions (Signed)
Can try motrin or aleve

## 2013-02-14 LAB — URINE CULTURE

## 2013-02-15 ENCOUNTER — Telehealth: Payer: Self-pay | Admitting: *Deleted

## 2013-02-15 NOTE — Telephone Encounter (Signed)
Left Message To Call Back  

## 2013-02-15 NOTE — Telephone Encounter (Signed)
Message copied by Alfonzo Feller on Fri Feb 15, 2013 11:10 AM ------      Message from: Elveria Rising      Created: Thu Feb 14, 2013  8:37 AM       Inform no infection, pain may be related to exercise change, can finish abx if desires or try the NSAIDS we discussed, motrin 400mg  ------

## 2013-02-15 NOTE — Telephone Encounter (Signed)
Patient notified see labs 

## 2013-02-15 NOTE — Telephone Encounter (Signed)
Patient was calling Megan Graham back. Call on cell

## 2013-03-21 ENCOUNTER — Ambulatory Visit (INDEPENDENT_AMBULATORY_CARE_PROVIDER_SITE_OTHER): Payer: Medicare Other | Admitting: Certified Nurse Midwife

## 2013-03-21 ENCOUNTER — Encounter: Payer: Self-pay | Admitting: Certified Nurse Midwife

## 2013-03-21 VITALS — BP 136/78 | HR 88 | Temp 97.9°F | Resp 16 | Ht 63.5 in | Wt 144.0 lb

## 2013-03-21 DIAGNOSIS — R300 Dysuria: Secondary | ICD-10-CM

## 2013-03-21 DIAGNOSIS — N390 Urinary tract infection, site not specified: Secondary | ICD-10-CM

## 2013-03-21 LAB — POCT URINALYSIS DIPSTICK
Bilirubin, UA: NEGATIVE
Blood, UA: 250
Glucose, UA: NEGATIVE
KETONES UA: NEGATIVE
Nitrite, UA: NEGATIVE
PH UA: 7
Protein, UA: NEGATIVE
UROBILINOGEN UA: NEGATIVE

## 2013-03-21 MED ORDER — CIPROFLOXACIN HCL 500 MG PO TABS: 500.0000 mg | ORAL_TABLET | Freq: Two times a day (BID) | ORAL | Status: DC

## 2013-03-21 NOTE — Patient Instructions (Signed)
Urinary Tract Infection  Urinary tract infections (UTIs) can develop anywhere along your urinary tract. Your urinary tract is your body's drainage system for removing wastes and extra water. Your urinary tract includes two kidneys, two ureters, a bladder, and a urethra. Your kidneys are a pair of bean-shaped organs. Each kidney is about the size of your fist. They are located below your ribs, one on each side of your spine.  CAUSES  Infections are caused by microbes, which are microscopic organisms, including fungi, viruses, and bacteria. These organisms are so small that they can only be seen through a microscope. Bacteria are the microbes that most commonly cause UTIs.  SYMPTOMS   Symptoms of UTIs may vary by age and gender of the patient and by the location of the infection. Symptoms in young women typically include a frequent and intense urge to urinate and a painful, burning feeling in the bladder or urethra during urination. Older women and men are more likely to be tired, shaky, and weak and have muscle aches and abdominal pain. A fever may mean the infection is in your kidneys. Other symptoms of a kidney infection include pain in your back or sides below the ribs, nausea, and vomiting.  DIAGNOSIS  To diagnose a UTI, your caregiver will ask you about your symptoms. Your caregiver also will ask to provide a urine sample. The urine sample will be tested for bacteria and white blood cells. White blood cells are made by your body to help fight infection.  TREATMENT   Typically, UTIs can be treated with medication. Because most UTIs are caused by a bacterial infection, they usually can be treated with the use of antibiotics. The choice of antibiotic and length of treatment depend on your symptoms and the type of bacteria causing your infection.  HOME CARE INSTRUCTIONS   If you were prescribed antibiotics, take them exactly as your caregiver instructs you. Finish the medication even if you feel better after you  have only taken some of the medication.   Drink enough water and fluids to keep your urine clear or pale yellow.   Avoid caffeine, tea, and carbonated beverages. They tend to irritate your bladder.   Empty your bladder often. Avoid holding urine for long periods of time.   Empty your bladder before and after sexual intercourse.   After a bowel movement, women should cleanse from front to back. Use each tissue only once.  SEEK MEDICAL CARE IF:    You have back pain.   You develop a fever.   Your symptoms do not begin to resolve within 3 days.  SEEK IMMEDIATE MEDICAL CARE IF:    You have severe back pain or lower abdominal pain.   You develop chills.   You have nausea or vomiting.   You have continued burning or discomfort with urination.  MAKE SURE YOU:    Understand these instructions.   Will watch your condition.   Will get help right away if you are not doing well or get worse.  Document Released: 11/03/2004 Document Revised: 07/26/2011 Document Reviewed: 03/04/2011  [employer] Patient Information 2014 [employer].

## 2013-03-21 NOTE — Progress Notes (Signed)
S:  73 y.o.Married Caucasian female presents with UTI Symptoms of urinary frequency, urgency and pain with urination for the past 3 days. Denies fever or chills, headache or backache or any new personal products or vaginal symptoms. Denies vaginal bleeding or vaginal dryness. ROS: feels well  O alert, oriented to person, place, and time, affect appropriate to mood   healthy,  not in acute distress, well developed and well nourished   Exam: Skin warm and dry CVAT negative bilateral Abdomen: positive suprapubic External genital area: no lesions, slightly atrophic Vagina: normal appearance, moist Urethra,Urethral meatus, Bladder all tender Cervix: normal, non tender Uterus:normal, non tender Adnexa: normal, non tender, no masses   Diagnostic Test:    Urinalysis rbc- 250, wbc 1+, ph 7.0    Assessment:Symptomatic UTI Menopausal  Past history of Breast cancer  P: Reviewed findings of UTI. Rx Cipro see order Can continue AZO for next 24-48 hours if needed while antibiotic is working BDZ:HGDJM micro/culture  Maintain adequate hydration. Follow up if symptoms not improving, and as needed.    Rv prn

## 2013-03-22 LAB — URINALYSIS, MICROSCOPIC ONLY
BACTERIA UA: NONE SEEN
CASTS: NONE SEEN
CRYSTALS: NONE SEEN
Squamous Epithelial / LPF: NONE SEEN

## 2013-03-22 LAB — URINE CULTURE
Colony Count: NO GROWTH
Organism ID, Bacteria: NO GROWTH

## 2013-03-22 NOTE — Progress Notes (Signed)
Reviewed personally.  M. Suzanne Dally Oshel, MD.  

## 2013-03-25 ENCOUNTER — Telehealth: Payer: Self-pay

## 2013-03-25 NOTE — Telephone Encounter (Signed)
Patient is calling Joy back said to call home number

## 2013-03-25 NOTE — Telephone Encounter (Signed)
Patient notified of results.

## 2013-03-25 NOTE — Telephone Encounter (Signed)
Message copied by Susy Manor on Mon Mar 25, 2013  9:47 AM ------      Message from: Regina Eck      Created: Mon Mar 25, 2013  8:56 AM       Notify urine culture negative,urine micro positive for WBC, needs TOC urine check and micro      Patient status ------

## 2013-03-25 NOTE — Telephone Encounter (Signed)
lmtcb

## 2013-04-03 ENCOUNTER — Ambulatory Visit (INDEPENDENT_AMBULATORY_CARE_PROVIDER_SITE_OTHER): Payer: Medicare Other | Admitting: *Deleted

## 2013-04-03 VITALS — BP 118/64 | HR 74 | Resp 14 | Wt 144.0 lb

## 2013-04-03 DIAGNOSIS — N390 Urinary tract infection, site not specified: Secondary | ICD-10-CM

## 2013-04-03 LAB — POCT URINALYSIS DIPSTICK
Bilirubin, UA: NEGATIVE
Glucose, UA: NEGATIVE
Ketones, UA: NEGATIVE
Leukocytes, UA: NEGATIVE
NITRITE UA: NEGATIVE
PH UA: 6
Protein, UA: NEGATIVE
RBC UA: NEGATIVE
UROBILINOGEN UA: NEGATIVE

## 2013-04-03 LAB — URINALYSIS, MICROSCOPIC ONLY
BACTERIA UA: NONE SEEN
Casts: NONE SEEN
Crystals: NONE SEEN
Squamous Epithelial / LPF: NONE SEEN

## 2013-04-03 NOTE — Progress Notes (Signed)
Lab visit today for a urine Dipstick and Micro.   Dipstick was neg (Micro) was sent to the lab. Will call pt with results.

## 2013-04-04 ENCOUNTER — Ambulatory Visit: Payer: Medicare Other

## 2013-04-10 ENCOUNTER — Encounter: Payer: Self-pay | Admitting: Gynecology

## 2013-12-02 ENCOUNTER — Ambulatory Visit (INDEPENDENT_AMBULATORY_CARE_PROVIDER_SITE_OTHER): Payer: Medicare Other | Admitting: Gynecology

## 2013-12-02 VITALS — BP 118/68 | HR 72 | Resp 16 | Ht 64.0 in | Wt 145.0 lb

## 2013-12-02 DIAGNOSIS — Z01419 Encounter for gynecological examination (general) (routine) without abnormal findings: Secondary | ICD-10-CM

## 2013-12-02 DIAGNOSIS — Z853 Personal history of malignant neoplasm of breast: Secondary | ICD-10-CM

## 2013-12-02 DIAGNOSIS — N904 Leukoplakia of vulva: Secondary | ICD-10-CM

## 2013-12-02 NOTE — Progress Notes (Signed)
73 y.o. Married Caucasian female   [id number] here for annual exam. Pt reports menses are absent due to Menopause. She does not report hot flashes, does not have night sweats, does not have vaginal dryness.  She is not using lubricants.  She does not report post-menopasual bleeding.  No LMP recorded. Patient is postmenopausal.          Sexually active: No.  The current method of family planning is post menopausal status.    Exercising: Yes.    spin,aerobics, silver sneaker 5x/wk Last pap: 11/11/2009 Negative Abnormal PAP: no Mammogram: 11/27/12 Bi-Rads 1 : Negative  -Done today 12/02/13 BSE: yes  Colonoscopy: 01/07/2008-Diverticulosis f/u in 5-10 years DEXA: 11/27/12 Alcohol: no Tobacco: no  Labs: Lajean Manes, MD  Health Maintenance  Topic Date Due  . Tetanus/tdap  10/16/1958  . Colonoscopy  10/15/1989  . Zostavax  10/16/1999  . Pneumococcal Polysaccharide Vaccine Age 34 And Over  10/15/2004  . Influenza Vaccine  09/07/2013  . Mammogram  11/28/2014    Family History  Problem Relation Age of Onset  . Hypertension Mother   . Osteoporosis Mother   . Asthma Brother   . Emphysema Brother     Patient Active Problem List   Diagnosis Date Noted  . History of breast cancer in female 11/26/2012    Past Medical History  Diagnosis Date  . Glaucoma     normal pressure  . Breast cancer     right-lumpectomy  . GERD (gastroesophageal reflux disease)   . Lymphedema of arm 04/2006    Right arm, Quit arimidex 07/2007  . Femoral hernia 1992  . Cellulitis of left arm     Past Surgical History  Procedure Laterality Date  . Breast lumpectomy  10/2001    lumpectomy, sentinel node bx, arimidex  . Glaucoma surgery  2013    low pressure  . Bunionectomy  1980    Allergies: Codeine and Sulfa antibiotics  Current Outpatient Prescriptions  Medication Sig Dispense Refill  . Artificial Tear Ointment (DRY EYES OP) Apply to eye.      Marland Kitchen atorvastatin (LIPITOR) 10 MG tablet       .  calcium-vitamin D (OSCAL) 250-125 MG-UNIT per tablet Take 1 tablet by mouth daily.      . cholecalciferol (VITAMIN D) 1000 UNITS tablet Take 2,000 Units by mouth daily.       . Coenzyme Q10 (CO Q 10 PO) Take 100 mg by mouth daily.      Marland Kitchen latanoprost (XALATAN) 0.005 % ophthalmic solution 1 drop at bedtime.        . Multiple Vitamins-Minerals (CENTRUM SILVER PO) Take by mouth.      . timolol (TIMOPTIC) 0.5 % ophthalmic solution       . ciprofloxacin (CIPRO) 500 MG tablet Take 1 tablet (500 mg total) by mouth 2 (two) times daily.  14 tablet  0  . Red Yeast Rice 600 MG CAPS Take by mouth daily.        No current facility-administered medications for this visit.    ROS: Pertinent items are noted in HPI.  Exam:    BP 118/68  Pulse 72  Resp 16  Ht 5\' 4"  (1.626 m)  Wt 145 lb (65.772 kg)  BMI 24.88 kg/m2 Weight change: @WEIGHTCHANGE @ Last 3 height recordings:  Ht Readings from Last 3 Encounters:  12/02/13 5\' 4"  (1.626 m)  03/21/13 5' 3.5" (1.613 m)  02/12/13 5' 6.5" (1.689 m)   General appearance: alert, cooperative and appears stated  age Head: Normocephalic, without obvious abnormality, atraumatic Neck: no adenopathy, no carotid bruit, no JVD, supple, symmetrical, trachea midline and thyroid not enlarged, symmetric, no tenderness/mass/nodules Lungs: clear to auscultation bilaterally Breasts: Inspection negative, No nipple retraction or dimpling, No nipple discharge or bleeding, No axillary or supraclavicular adenopathy, right retracted with scar and RT Heart: regular rate and rhythm, S1, S2 normal, no murmur, click, rub or gallop Abdomen: soft, non-tender; bowel sounds normal; no masses,  no organomegaly Extremities: extremities normal, atraumatic, no cyanosis or edema Skin: Skin color, texture, turgor normal. No rashes or lesions Lymph nodes: Cervical, supraclavicular, and axillary nodes normal. no inguinal nodes palpated Neurologic: Grossly normal   Pelvic: External genitalia:   Right sided area of smooth leukoplakia,                  Urethra: normal appearing urethra with no masses, tenderness or lesions              Bartholins and Skenes: Bartholin's, Urethra, Skene's normal                 Vagina: atrophic, smooth, no discharge              Cervix: normal appearance              Pap taken: No.        Bimanual Exam:  Uterus:  uterus is normal size, shape, consistency and nontender                                      Adnexa:    no masses                                      Rectovaginal: Confirms                                      Anus:  normal sphincter tone, no lesions      1. Encounter for routine gynecological examination  counseled on breast self exam, mammography screening, adequate intake of calcium and vitamin D, diet and exercise return annually or prn Discussed PAP guideline changes, importance of weight bearing exercises, calcium, vit D and balanced diet.  2. History of breast cancer in female Doing well, 3D mammogram today  3. Leukoplakia of vulva Pt will rto for re-evaluation and possible biopsy, will stop spinning in interrim  An After Visit Summary was printed and given to the patient.

## 2013-12-04 ENCOUNTER — Telehealth: Payer: Self-pay | Admitting: Gynecology

## 2013-12-04 NOTE — Telephone Encounter (Signed)
Patient due for two week recheck 12/18/13 and was scheduled with Dr. Charlies Constable. She does not want to see anyone else but Dr. Sabra Heck. Please advise?

## 2013-12-05 NOTE — Telephone Encounter (Signed)
Patient is scheduled for follow up with Dr. Sabra Heck with possible procedure for 12/26/13 at 1000. Advised follow up with possible procedure. Patient agreeable.  Vulvar bx is precerted.  Routing to provider for final review. Patient agreeable to disposition. Will close encounter

## 2013-12-18 ENCOUNTER — Ambulatory Visit: Payer: Medicare Other | Admitting: Gynecology

## 2013-12-26 ENCOUNTER — Ambulatory Visit (INDEPENDENT_AMBULATORY_CARE_PROVIDER_SITE_OTHER): Payer: Medicare Other | Admitting: Obstetrics & Gynecology

## 2013-12-26 DIAGNOSIS — N904 Leukoplakia of vulva: Secondary | ICD-10-CM

## 2013-12-26 NOTE — Progress Notes (Signed)
Subjective:     Patient ID: Megan Graham, female   DOB: 03-Mar-1940, 73 y.o.   MRN: [id number]  HPI 73 yo G0 MWF here for possible vulvar biopsy.  Pt seen for AEX on 12/02/13.  Was informed she had a whitish lesion on her left labia.  Pt reports it was on the left, although note from that day states it is on the right.  Pt has been feeling a possible "raised" area on the left as well so she is pretty certain this was the side.    Pt is regular exerciser and was told to stop doing spinning and return for possible biopsy after a few weeks.  Denies vaginal pain or vaginal bleeding.  She has no other complaints.  Has been on the Internet and is a little anxious about this.  Review of Systems  All other systems reviewed and are negative.      Objective:   Physical Exam  Constitutional: She is oriented to person, place, and time. She appears well-developed and well-nourished.  Genitourinary: Vagina normal.    There is no rash, tenderness or lesion on the right labia. There is no rash, tenderness or lesion on the left labia.  Lymphadenopathy:       Right: No inguinal adenopathy present.       Left: No inguinal adenopathy present.  Neurological: She is alert and oriented to person, place, and time.  Skin: Skin is warm and dry.  Psychiatric: She has a normal mood and affect.   Due to no visible lesions being noted a vulvar colposcopy was performed.  3% acetic acid applied to skin via soaked 4-4 sponges for >3 minutes.  Entire external vulva was visualized with both 7.5X and 15X magnification.  Green filter also used.  No abnormal appearing tissue was present.  I did not feel any biopsies were warranted.  Findings discussed with pt.  Vinegar solution cleansed from skin and procedure was ended.       Assessment:     Questionable area noted on AEX of leukoplakia that is not present today on visual inspection or with colposocpy    Plan:     Pt reassured.  Will follow up with pt for AEX 2017.   appt changed to me.  All questions answered.

## 2014-09-01 ENCOUNTER — Telehealth: Payer: Self-pay | Admitting: Obstetrics & Gynecology

## 2014-09-01 NOTE — Telephone Encounter (Signed)
Spoke with patient. Patient states when she sits down she feels a "pressure like something is pushing up into my abdomen." Began on Friday 7/22. Patient is unable to state if she feels the pressure vaginally. Denies any urinary symptoms. Is having intermittent pain on her left side as well. Denies any current pain or discomfort. Is having vaginal itching without discharge. Advised patient will need to be seen in office for further evaluation. Patient is agreeable. Offered appointment today with NP as Dr.Miller has two surgeries today. Patient declines. Appointment scheduled for tomorrow 7/26 at 10:30am with Dr.Miller. Patient is agreeable to date and time. If symptoms increase or develops new symptoms will return call to be seen today.  Routing to provider for final review. Patient agreeable to disposition. Will close encounter.   Patient aware provider will review message and nurse will return call if any additional advice or change of disposition.

## 2014-09-01 NOTE — Telephone Encounter (Signed)
Pt states when she sits down she feels "pressure" in her abdomen "like somethings pushing up". Pt also states she's had some pain on her left side. Pt requests appointment with dr Sabra Heck.

## 2014-09-02 ENCOUNTER — Ambulatory Visit (INDEPENDENT_AMBULATORY_CARE_PROVIDER_SITE_OTHER): Payer: Medicare Other | Admitting: Obstetrics & Gynecology

## 2014-09-02 ENCOUNTER — Encounter: Payer: Self-pay | Admitting: Obstetrics & Gynecology

## 2014-09-02 VITALS — BP 124/80 | HR 68 | Temp 98.3°F | Resp 16 | Ht 64.0 in | Wt 150.0 lb

## 2014-09-02 DIAGNOSIS — L298 Other pruritus: Secondary | ICD-10-CM

## 2014-09-02 DIAGNOSIS — R1032 Left lower quadrant pain: Secondary | ICD-10-CM | POA: Diagnosis not present

## 2014-09-02 DIAGNOSIS — N898 Other specified noninflammatory disorders of vagina: Secondary | ICD-10-CM

## 2014-09-02 MED ORDER — FLUCONAZOLE 150 MG PO TABS: ORAL_TABLET | ORAL | Status: DC

## 2014-09-02 NOTE — Progress Notes (Signed)
Subjective:     Patient ID: Megan Graham, female   DOB: August 06, 1940, 74 y.o.   MRN: [id number]  HPI 74 yo G0 MWF here for complaint of increased vaginal pressure and vaginal itching that she noticed about one week ago.  She feels the pressure is worse with standing up.  Reports pain in left side and in LLQ.  This was associated with the pressure.  Pain is better now but feels it not constantly and now it has a colicky characteristic to it.    No vaginal bleeding.  Pt has h/o increased frequency of urinary but she doesn't feel like that has changed.  Denies blood in urine.  No change urinary habits.  Denies constipation or bowel habit changes as well.  No blood in stool.    Pt also having some vulvar/vaginal itching.  She does spinning twice a week and really gets hot and sweats when doing this.  Pt reports this is internal more than external.  Denies vaginal discharge.  Denies vaginal odor.  Reports this have been going on "for some time" but just hasn't had it evaluated.    Last colonoscopy 2009, per pt.  Reports needed again in 10 years.    Review of Systems  All other systems reviewed and are negative.      Objective:   Physical Exam  Constitutional: She is oriented to person, place, and time. She appears well-developed and well-nourished.  Abdominal: Soft. Bowel sounds are normal.  Genitourinary: Vagina normal and uterus normal.    There is rash on the right labia. There is no tenderness, lesion or injury on the right labia. There is rash on the left labia. There is no tenderness, lesion or injury on the left labia. Uterus is not enlarged and not tender. Cervix exhibits no motion tenderness and no discharge. Right adnexum displays no mass, no tenderness and no fullness. Left adnexum displays no mass, no tenderness and no fullness. No vaginal discharge found.  No evidence of significant prolapse.  Lymphadenopathy:       Right: No inguinal adenopathy present.       Left: No inguinal  adenopathy present.  Neurological: She is alert and oriented to person, place, and time.  Skin: Skin is warm and dry.  Psychiatric: She has a normal mood and affect.       Assessment:     Vulvar itching/vaginal itching  LLQ pain, improved over the past week     Plan:     Diflucan 150mg  po x 1, repeat 72 hours.  #2/0RF.  If this doesn't completely resolve problems, will treat with Terazol. Urine culture pending  May need to proceed with TVUS and/or CT if pain continues

## 2014-09-03 LAB — URINE CULTURE
COLONY COUNT: NO GROWTH
ORGANISM ID, BACTERIA: NO GROWTH

## 2014-09-12 ENCOUNTER — Other Ambulatory Visit: Payer: Self-pay | Admitting: Obstetrics & Gynecology

## 2014-09-12 ENCOUNTER — Telehealth: Payer: Self-pay | Admitting: Obstetrics & Gynecology

## 2014-09-12 DIAGNOSIS — I890 Lymphedema, not elsewhere classified: Secondary | ICD-10-CM

## 2014-09-12 DIAGNOSIS — R1032 Left lower quadrant pain: Secondary | ICD-10-CM

## 2014-09-12 MED ORDER — TERCONAZOLE 0.4 % VA CREA: 1.0000 | TOPICAL_CREAM | Freq: Every day | VAGINAL | Status: DC

## 2014-09-12 NOTE — Telephone Encounter (Signed)
Patient is calling back to give an update to [facility] or the triage nurse

## 2014-09-12 NOTE — Telephone Encounter (Signed)
Spoke with patient. Advised of message as seen below from [place]. Patient is agreeable to scheduling at this time. PUS scheduled for 8/9 at 1pm with 1:30 pm consult with Dr.Miller. Patient is agreeable to date and time. Order placed for precert.  Cc: Theresia Lo  Routing to provider for final review. Patient agreeable to disposition. Will close encounter.   Patient aware provider will review message and nurse will return call if any additional advice or change of disposition.

## 2014-09-12 NOTE — Telephone Encounter (Signed)
Spoke with patient. Was seen on 09/02/2014 for LLQ pain and vaginal itching. Treated for yeast infection with Diflucan 150 mg po X1 repeat in 72 hours if symptoms persist. Completed course. Patient states that she no longer has pressure when she is sitting. Pain in side has decreased. Has only occurred twice since visit. Is still experiencing yeast infection symptoms. Is having vaginal itching. Requesting rx for Terazol at this time. Per Dr.Miller's OV note if symptoms persist okay for Terazol. Rx for Terazol 7 cream place one applicator at bedtime for 7 days sent to [facility] on file. Patient asking if she should proceed with PUS at this time. Advised will speak with Dr.Miller and return call with further recommendations. Patient is agreeable.

## 2014-09-12 NOTE — Telephone Encounter (Signed)
Would also like her to have a PUS, just to be sure.

## 2014-09-12 NOTE — Progress Notes (Signed)
Referral for physical therapy placed for Megan Graham for review of lymphedema.  Will you let pt know I did get a message back from Dr. Jana Hakim and he thought a repeat evaluation was appropriate.   Order has been placed.

## 2014-09-12 NOTE — Progress Notes (Signed)
Spoke with patient. Advised of message as seen below from [place]. Patient is agreeable and verbalizes understanding. Call to [facility] with [facility] at [phone] regarding referral. They are open Monday through Thursday 8-5 and Friday 8-12. Advised our referrals coordinator in the office will work on this referral and she will either be called directly by our office regarding appointment or by the rehabilitation center to schedule. Patient is agreeable. Aware if she does not hear something regarding referral by this time next week to let our office know. Patient is agreeable.  Cc: Theresia Lo  Routing to provider for final review. Patient agreeable to disposition. Will close encounter.   Patient aware provider will review message and nurse will return call if any additional advice or change of disposition.

## 2014-09-12 NOTE — Telephone Encounter (Signed)
Returning a call to Kaitlyn. °

## 2014-09-12 NOTE — Telephone Encounter (Signed)
Attempted to reach patient at number provided [phone]. Line is busy. Attempted to reach patient at cell phone number provided [phone]. There was no answer. Left message to return call.

## 2014-09-12 NOTE — Telephone Encounter (Signed)
Routing to Dr Miller for review.  

## 2014-09-15 ENCOUNTER — Telehealth: Payer: Self-pay | Admitting: Obstetrics & Gynecology

## 2014-09-15 NOTE — Telephone Encounter (Signed)
Spoke with Rose @ [facility] and appt is scheduled. Called pt to notify. Spoke with her on her cell phone. She was on a walk and asked me to call her house phone and leave the information on her answering machine. Left details and at the very end the machine cut off with a long beep. Patient also stated she was sent a reminder through mychart. Note to Dr Sabra Heck. Ok to close

## 2014-09-15 NOTE — Telephone Encounter (Signed)
Called patient to verify benefit. Patient understands and agreeable. Verified arrival time for appointment. Ok to close

## 2014-09-16 ENCOUNTER — Other Ambulatory Visit: Payer: Self-pay | Admitting: Obstetrics & Gynecology

## 2014-09-16 ENCOUNTER — Ambulatory Visit (INDEPENDENT_AMBULATORY_CARE_PROVIDER_SITE_OTHER): Payer: Medicare Other | Admitting: Obstetrics & Gynecology

## 2014-09-16 ENCOUNTER — Ambulatory Visit (INDEPENDENT_AMBULATORY_CARE_PROVIDER_SITE_OTHER): Payer: Medicare Other

## 2014-09-16 ENCOUNTER — Encounter: Payer: Self-pay | Admitting: Obstetrics & Gynecology

## 2014-09-16 VITALS — BP 118/80 | HR 68 | Resp 16 | Wt 151.6 lb

## 2014-09-16 DIAGNOSIS — N840 Polyp of corpus uteri: Secondary | ICD-10-CM

## 2014-09-16 DIAGNOSIS — N9489 Other specified conditions associated with female genital organs and menstrual cycle: Secondary | ICD-10-CM | POA: Diagnosis not present

## 2014-09-16 DIAGNOSIS — R1032 Left lower quadrant pain: Secondary | ICD-10-CM | POA: Diagnosis not present

## 2014-09-16 DIAGNOSIS — R102 Pelvic and perineal pain: Secondary | ICD-10-CM

## 2014-09-16 MED ORDER — HYDROCODONE-ACETAMINOPHEN 5-325 MG PO TABS: 1.0000 | ORAL_TABLET | Freq: Four times a day (QID) | ORAL | Status: DC | PRN

## 2014-09-16 NOTE — Progress Notes (Signed)
74 y.o. Megan Graham here for a pelvic ultrasound due to several weeks complaint of pelvic pressure.  Has not had vaginal discharge or odor.  Being treated for yeast vaginitis without a lot of change in symptoms.  Denies vaginal bleeding.  H/O breast cancer s/p right lumpectomy with subsequent lymphedema on the same side.  Patient's last menstrual period was 02/07/1989.  Sexually active:  no  Contraception: PMP status  FINDINGS: UTERUS: 6.0 x 3.1 x 2.4cm EMS: thickened with fluid within cavity.  At least three probable polyps noted, 8.87mm, 6.49mm, and 4.21mm ADNEXA:   Left ovary 2.0 x 0.7 x 0.7cm   Right ovary 2.1 x 0.9 x 1.1cm CUL DE SAC: no free lfuid OTHER:  PVR of bladder 130cc (pt states she was hurrying for ultrasound and didn't empty bladder completely)  Reviewed findings with patient.  Possible urologist evaluation and hysteroscopy with polyp removal discussed.  Pt would like to start with the hysteroscopy first and see if this resolves symptoms before proceeding with urology evaluation.  Procedure discussed with patient.  Recovery and pain management discussed.  Risks discussed including but not limited to bleeding, rare risk of transfusion, infection, 1% risk of uterine perforation with risks of fluid deficit causing cardiac arrythmia, cerebral swelling and/or need to stop procedure early.  Fluid emboli and rare risk of death discussed.  DVT/PE, rare risk of risk of bowel/bladder/ureteral/vascular injury.  Patient aware if pathology abnormal she may need additional treatment.  All questions answered.    Exam:  Gen:  WNWD WF with NAD   CV:  RRR without M/R/G   Resp:  CTA bilaterally without abnormal breath sounds  Assessment:  Pelvic pressure with endometrial polyps noted on ultrasound today. H/O breast cancer Increased post void residual Lymphedema of left arm  Plan: Hysteroscopy with polyp resection recommended.  Alternatives of watching and repeating ultrasound discussed.  Pt  would like to proceed but does want to discuss with spouse first.  Information about surgery and scheduling discussed.  ACOG bulletin on hysteroscopy given.  ~25 minutes spent with patient >50% of time was in face to face discussion of above.

## 2014-09-17 ENCOUNTER — Ambulatory Visit: Payer: Medicare Other | Attending: Obstetrics & Gynecology | Admitting: Physical Therapy

## 2014-09-17 ENCOUNTER — Ambulatory Visit: Payer: Medicare Other | Admitting: Physical Therapy

## 2014-09-17 DIAGNOSIS — I890 Lymphedema, not elsewhere classified: Secondary | ICD-10-CM | POA: Insufficient documentation

## 2014-09-17 DIAGNOSIS — E8989 Other postprocedural endocrine and metabolic complications and disorders: Secondary | ICD-10-CM | POA: Diagnosis present

## 2014-09-17 NOTE — Therapy (Addendum)
[place], [place], [address] Phone: [phone]   Fax:  [phone]  Physical Therapy Evaluation  Patient Details  Name: Megan Graham MRN: [id number] Date of Birth: May 04, 1940 Referring Provider:  Megan Salon, MD  Encounter Date: 09/17/2014    Past Medical History  Diagnosis Date  . Glaucoma     low pressure  . Breast cancer ([place])     right-lumpectomy  . GERD (gastroesophageal reflux disease)   . Lymphedema of arm 04/2006    Right arm, Quit arimidex 07/2007  . Femoral hernia 1992  . Cellulitis of left arm     right arm not left   . PONV (postoperative nausea and vomiting)     primarily nausea     Past Surgical History  Procedure Laterality Date  . Breast lumpectomy  10/2001    lumpectomy, sentinel node bx, arimidex  . Bunionectomy  1980  . Femoral hernia repair    . Dilatation & curettage/hysteroscopy with myosure N/A 10/27/2014    Procedure: DILATATION & CURETTAGE/HYSTEROSCOPY WITH RESECTOCOPE AND MYOSURE ;  Surgeon: Megan Salon, MD;  Location: [facility];  Service: Gynecology;  Laterality: N/A;    There were no vitals filed for this visit.  Visit Diagnosis:  Lymphedema of upper extremity following lymphadenectomy - Plan: PT plan of care cert/re-cert                                     Long Term Clinic Goals - 09/17/14 1558    CC Long Term Goal  #1   Title Independent with self-care and knowledgeable about how to approach trial of going without daytime compression sleeve on every day.   Period --  at eval   Status Achieved             Problem List Patient Active Problem List   Diagnosis Date Noted  . Glaucoma 10/06/2014  . History of breast cancer in female 11/26/2012    [facility] 04/16/2015, 5:33 PM  [place], [place], [address] Phone: [phone]    Fax:  Nicholson, PT 04/16/2015 5:33 PM  PHYSICAL THERAPY DISCHARGE SUMMARY  Visits from Start of Care: 1  Current functional level related to goals / functional outcomes: Goal met as noted above.   Remaining deficits: None   Education / Equipment: About continuing self-care for lymphedema Plan: Patient agrees to discharge.  Patient goals were met. Patient is being discharged due to meeting the stated rehab goals.  ?????       Serafina Royals, PT 04/16/2015 5:33 PM

## 2014-09-22 ENCOUNTER — Telehealth: Payer: Self-pay | Admitting: *Deleted

## 2014-09-22 NOTE — Telephone Encounter (Signed)
Call to patient to follow-up on scheduling surgical procedure.  Patient requesting to proceed on 10-06-14 as first patient. Advised that this date is not available. Takes a few weeks to get surgery scheduled. Discussed 10-27-14 and patient states this date will work very well for her. Desires to proceed. Will call patient back once scheduled, confirmed and precert completed.

## 2014-09-26 ENCOUNTER — Telehealth: Payer: Self-pay | Admitting: Obstetrics & Gynecology

## 2014-09-26 NOTE — Telephone Encounter (Signed)
Call to patient. Advised of surgery date of 10-27-14 at [place] at [facility]. Patient notified and is agreeable.  Surgery instruction sheet reviewed and printed copy mailed, see scanned copy.  Routing to provider for final review. Patient agreeable to disposition. Will close encounter.

## 2014-09-26 NOTE — Telephone Encounter (Signed)
Spoke with patient regarding professional surgery benefit. Reviewed cancellation policy and fee. Reviewed information regarding professional benefit not including hospital services. Patient understood and agreeable. Patient agreeable to call from [facility] regarding any other surgery instructions. Patient requests call at home number after 2pm today.

## 2014-09-26 NOTE — Telephone Encounter (Signed)
Patient says she is returning a call to [facility]. No open tc note.

## 2014-09-28 ENCOUNTER — Other Ambulatory Visit: Payer: Self-pay | Admitting: Obstetrics & Gynecology

## 2014-09-28 NOTE — Telephone Encounter (Signed)
Orders placed for surgery.

## 2014-09-30 NOTE — Telephone Encounter (Signed)
See next phone encounter. All surgery instructions completed on 09-26-14.  Routing to provider for final review. Will close encounter.

## 2014-10-06 ENCOUNTER — Encounter: Payer: Self-pay | Admitting: Obstetrics & Gynecology

## 2014-10-06 ENCOUNTER — Ambulatory Visit (INDEPENDENT_AMBULATORY_CARE_PROVIDER_SITE_OTHER): Payer: Medicare Other | Admitting: Obstetrics & Gynecology

## 2014-10-06 VITALS — BP 122/76 | HR 64 | Resp 16 | Wt 153.0 lb

## 2014-10-06 DIAGNOSIS — Z124 Encounter for screening for malignant neoplasm of cervix: Secondary | ICD-10-CM

## 2014-10-06 DIAGNOSIS — H409 Unspecified glaucoma: Secondary | ICD-10-CM | POA: Diagnosis not present

## 2014-10-06 DIAGNOSIS — N840 Polyp of corpus uteri: Secondary | ICD-10-CM | POA: Diagnosis not present

## 2014-10-06 NOTE — Progress Notes (Signed)
74 y.o. G0P0000 MarriedCaucasian female here for discussion of upcoming procedure.  D&C/hysteroscopy with resectocope planned due to thickened endometrium and endometrial polyps.  Pre-op evaluation thus far has included ultrasound done 8/9//16 showing 6 x 3.1 x 2.4cm uterus with 6.39mm endometrial thickening with at least three polyps.    Ob Hx:   Patient's last menstrual period was 02/07/1989.          Sexually active: No. Birth control: postmenopausal Last pap: 11/11/09-WNL Last MMG: 12/02/13 3D BiRads 2-Benign Tobacco: no  Past Surgical History  Procedure Laterality Date  . Breast lumpectomy  10/2001    lumpectomy, sentinel node bx, arimidex  . Bunionectomy  1980  . Femoral hernia repair      Past Medical History  Diagnosis Date  . Glaucoma     low pressure  . Breast cancer     right-lumpectomy  . GERD (gastroesophageal reflux disease)   . Lymphedema of arm 04/2006    Right arm, Quit arimidex 07/2007  . Femoral hernia 1992  . Cellulitis of left arm     Allergies: Codeine and Sulfa antibiotics  Current Outpatient Prescriptions  Medication Sig Dispense Refill  . Artificial Tear Ointment (DRY EYES OP) Apply to eye.    Marland Kitchen atorvastatin (LIPITOR) 10 MG tablet daily.     . Cholecalciferol (VITAMIN D) 2000 UNITS CAPS Take 2,000 Units by mouth daily.    . Coenzyme Q10 (CO Q 10 PO) Take 100 mg by mouth daily.    Marland Kitchen latanoprost (XALATAN) 0.005 % ophthalmic solution 1 drop at bedtime.      . Multiple Vitamins-Minerals (CENTRUM SILVER PO) Take by mouth.    . NON FORMULARY citracal +D 250-125 mg-unit daily    . timolol (TIMOPTIC) 0.5 % ophthalmic solution One drop each eye in the am and the pm    . FLUARIX QUADRIVALENT 0.5 ML injection   0  . fluconazole (DIFLUCAN) 150 MG tablet Take one tablet.  Repeat in 72 hours if symptoms are not completely resolved. (Patient not taking: Reported on 09/17/2014) 2 tablet 0  . terconazole (TERAZOL 7) 0.4 % vaginal cream Place 1 applicator vaginally at  bedtime. For 7 days (Patient not taking: Reported on 09/17/2014) 45 g 0   No current facility-administered medications for this visit.    ROS: A comprehensive review of systems was negative.  Exam:    BP 122/76 mmHg  Pulse 64  Resp 16  Wt 153 lb (69.4 kg)  LMP 02/07/1989  General appearance: alert and cooperative Head: Normocephalic, without obvious abnormality, atraumatic Neck: no adenopathy, supple, symmetrical, trachea midline and thyroid not enlarged, symmetric, no tenderness/mass/nodules Lungs: clear to auscultation bilaterally Heart: regular rate and rhythm, S1, S2 normal, no murmur, click, rub or gallop Abdomen: soft, non-tender; bowel sounds normal; no masses,  no organomegaly Extremities: extremities normal, atraumatic, no cyanosis or edema Skin: Skin color, texture, turgor normal. No rashes or lesions Lymph nodes: Cervical, supraclavicular, and axillary nodes normal. no inguinal nodes palpated Neurologic: Grossly normal  Pelvic: External genitalia:  no lesions              Urethra: normal appearing urethra with no masses, tenderness or lesions              Bartholins and Skenes: normal                 Vagina: normal appearing vagina with normal color and discharge, no lesions, atrophic  Cervix: normal appearance              Pap taken: Yes.          Bimanual Exam:  Uterus:  uterus is normal size, shape, consistency and nontender                                      Adnexa:    normal adnexa in size, nontender and no masses                                      Rectovaginal: Deferred                                      Anus:  normal sphincter tone, no lesions  A: Thickened endometrium, endometrial polyps H/O breast cancer    P: Hysteroscopy with polyp resection, D&C planned No pain medications recommended Medications/Vitamins reviewed   Post op instruction brochure given for pre and post op

## 2014-10-08 LAB — IPS PAP SMEAR ONLY

## 2014-10-10 ENCOUNTER — Telehealth: Payer: Self-pay | Admitting: *Deleted

## 2014-10-10 NOTE — Telephone Encounter (Signed)
Call to patient to notify of surgery start time change. Patient husband, Natale Milch answered, ROI gives permission to speak with him. Advised start time is now 9 am and needs to arrive at hospital at 0730 on 10-27-14. Patient can call with questions.  Routing to provider for final review.  Will close encounter.

## 2014-10-23 ENCOUNTER — Other Ambulatory Visit: Payer: Self-pay

## 2014-10-23 ENCOUNTER — Encounter (HOSPITAL_COMMUNITY)
Admission: RE | Admit: 2014-10-23 | Discharge: 2014-10-23 | Disposition: A | Discharge status: Home / Self Care | Payer: Medicare Other | Source: Ambulatory Visit | Attending: Obstetrics & Gynecology | Admitting: Obstetrics & Gynecology

## 2014-10-23 ENCOUNTER — Encounter (HOSPITAL_COMMUNITY): Payer: Self-pay

## 2014-10-23 DIAGNOSIS — Z01818 Encounter for other preprocedural examination: Secondary | ICD-10-CM | POA: Insufficient documentation

## 2014-10-23 DIAGNOSIS — Z853 Personal history of malignant neoplasm of breast: Secondary | ICD-10-CM | POA: Diagnosis not present

## 2014-10-23 DIAGNOSIS — N840 Polyp of corpus uteri: Secondary | ICD-10-CM | POA: Insufficient documentation

## 2014-10-23 HISTORY — DX: Other specified postprocedural states: Z98.890

## 2014-10-23 HISTORY — DX: Nausea with vomiting, unspecified: R11.2

## 2014-10-23 LAB — CBC
HEMATOCRIT: 39.6 % (ref 36.0–46.0)
HEMOGLOBIN: 13.3 g/dL (ref 12.0–15.0)
MCH: 31.4 pg (ref 26.0–34.0)
MCHC: 33.6 g/dL (ref 30.0–36.0)
MCV: 93.6 fL (ref 78.0–100.0)
Platelets: 207 10*3/uL (ref 150–400)
RBC: 4.23 MIL/uL (ref 3.87–5.11)
RDW: 12.6 % (ref 11.5–15.5)
WBC: 6.9 10*3/uL (ref 4.0–10.5)

## 2014-10-23 NOTE — Patient Instructions (Signed)
Your procedure is scheduled on:  October 27, 2014    Enter through the Main Entrance of [facility] at:  7:15 am   Pick up the phone at the desk and dial [phone].  Call this number if you have problems the morning of surgery: [phone].  Remember: Do NOT eat food: after midnight on Sunday  Do NOT drink clear liquids after:  Midnight on Sunday  Take these medicines the morning of surgery with a SIP OF WATER:  None   Do NOT wear jewelry (body piercing), metal hair clips/bobby pins, make-up, or nail polish. Do NOT wear lotions, powders, or perfumes.  You may wear deoderant. Do NOT shave for 48 hours prior to surgery. Do NOT bring valuables to the hospital. Contacts, dentures, or bridgework may not be worn into surgery. Have a responsible adult drive you home and stay with you for 24 hours after your procedure

## 2014-10-27 ENCOUNTER — Ambulatory Visit (HOSPITAL_COMMUNITY)
Admission: RE | Admit: 2014-10-27 | Discharge: 2014-10-27 | Disposition: A | Discharge status: Home / Self Care | Payer: Medicare Other | Source: Ambulatory Visit | Attending: Obstetrics & Gynecology | Admitting: Obstetrics & Gynecology

## 2014-10-27 ENCOUNTER — Encounter (HOSPITAL_COMMUNITY)
Admission: RE | Disposition: A | Discharge status: Home / Self Care | Payer: Self-pay | Source: Ambulatory Visit | Attending: Obstetrics & Gynecology

## 2014-10-27 ENCOUNTER — Encounter (HOSPITAL_COMMUNITY): Payer: Self-pay | Admitting: Anesthesiology

## 2014-10-27 ENCOUNTER — Ambulatory Visit (HOSPITAL_COMMUNITY): Payer: Medicare Other | Admitting: Certified Registered Nurse Anesthetist

## 2014-10-27 DIAGNOSIS — N840 Polyp of corpus uteri: Secondary | ICD-10-CM | POA: Diagnosis not present

## 2014-10-27 DIAGNOSIS — R938 Abnormal findings on diagnostic imaging of other specified body structures: Secondary | ICD-10-CM | POA: Insufficient documentation

## 2014-10-27 DIAGNOSIS — Z853 Personal history of malignant neoplasm of breast: Secondary | ICD-10-CM | POA: Diagnosis not present

## 2014-10-27 HISTORY — PX: DILATATION & CURETTAGE/HYSTEROSCOPY WITH MYOSURE: SHX6511

## 2014-10-27 SURGERY — DILATATION & CURETTAGE/HYSTEROSCOPY WITH MYOSURE
Anesthesia: General

## 2014-10-27 MED ORDER — ONDANSETRON HCL 4 MG/2ML IJ SOLN: 4.0000 mg | Freq: Once | INTRAMUSCULAR | Status: DC | PRN

## 2014-10-27 MED ORDER — LIDOCAINE HCL (CARDIAC) 20 MG/ML IV SOLN
INTRAVENOUS | Status: AC
  Filled 2014-10-27: qty 5

## 2014-10-27 MED ORDER — ACETAMINOPHEN 160 MG/5ML PO SOLN: 325.0000 mg | ORAL | Status: DC | PRN

## 2014-10-27 MED ORDER — LACTATED RINGERS IV SOLN
INTRAVENOUS | Status: DC
  Administered 2014-10-27: 07:00:00 via INTRAVENOUS

## 2014-10-27 MED ORDER — FENTANYL CITRATE (PF) 100 MCG/2ML IJ SOLN: 25.0000 ug | INTRAMUSCULAR | Status: DC | PRN

## 2014-10-27 MED ORDER — PROPOFOL 10 MG/ML IV BOLUS
INTRAVENOUS | Status: DC | PRN
  Administered 2014-10-27: 100 mg via INTRAVENOUS

## 2014-10-27 MED ORDER — CEFAZOLIN SODIUM-DEXTROSE 2-3 GM-% IV SOLR
INTRAVENOUS | Status: AC
  Filled 2014-10-27: qty 50

## 2014-10-27 MED ORDER — LIDOCAINE HCL (CARDIAC) 20 MG/ML IV SOLN
INTRAVENOUS | Status: DC | PRN
  Administered 2014-10-27: 100 mg via INTRAVENOUS

## 2014-10-27 MED ORDER — LIDOCAINE-EPINEPHRINE 1 %-1:100000 IJ SOLN
INTRAMUSCULAR | Status: AC
  Filled 2014-10-27: qty 1

## 2014-10-27 MED ORDER — ONDANSETRON HCL 4 MG/2ML IJ SOLN
INTRAMUSCULAR | Status: AC
  Filled 2014-10-27: qty 2

## 2014-10-27 MED ORDER — KETOROLAC TROMETHAMINE 30 MG/ML IJ SOLN
INTRAMUSCULAR | Status: DC | PRN
  Administered 2014-10-27: 15 mg via INTRAVENOUS

## 2014-10-27 MED ORDER — GLYCOPYRROLATE 0.2 MG/ML IJ SOLN
INTRAMUSCULAR | Status: AC
  Filled 2014-10-27: qty 1

## 2014-10-27 MED ORDER — CEFAZOLIN SODIUM-DEXTROSE 2-3 GM-% IV SOLR
2.0000 g | INTRAVENOUS | Status: AC
  Administered 2014-10-27: 2 g via INTRAVENOUS

## 2014-10-27 MED ORDER — LIDOCAINE-EPINEPHRINE 1 %-1:100000 IJ SOLN
INTRAMUSCULAR | Status: DC | PRN
  Administered 2014-10-27: 10 mL

## 2014-10-27 MED ORDER — DEXAMETHASONE SODIUM PHOSPHATE 10 MG/ML IJ SOLN
INTRAMUSCULAR | Status: DC | PRN
  Administered 2014-10-27: 10 mg via INTRAVENOUS

## 2014-10-27 MED ORDER — GLYCOPYRROLATE 0.2 MG/ML IJ SOLN
INTRAMUSCULAR | Status: DC | PRN
  Administered 2014-10-27: 0.2 mg via INTRAVENOUS

## 2014-10-27 MED ORDER — PROPOFOL 10 MG/ML IV BOLUS
INTRAVENOUS | Status: AC
  Filled 2014-10-27: qty 20

## 2014-10-27 MED ORDER — FENTANYL CITRATE (PF) 100 MCG/2ML IJ SOLN
INTRAMUSCULAR | Status: AC
  Filled 2014-10-27: qty 4

## 2014-10-27 MED ORDER — MEPERIDINE HCL 25 MG/ML IJ SOLN: 6.2500 mg | INTRAMUSCULAR | Status: DC | PRN

## 2014-10-27 MED ORDER — KETOROLAC TROMETHAMINE 30 MG/ML IJ SOLN
INTRAMUSCULAR | Status: AC
Start: 2014-10-27 — End: 2014-10-27
  Filled 2014-10-27: qty 1

## 2014-10-27 MED ORDER — FENTANYL CITRATE (PF) 100 MCG/2ML IJ SOLN
INTRAMUSCULAR | Status: DC | PRN
  Administered 2014-10-27 (×2): 25 ug via INTRAVENOUS

## 2014-10-27 MED ORDER — ACETAMINOPHEN 325 MG PO TABS: 325.0000 mg | ORAL_TABLET | ORAL | Status: DC | PRN

## 2014-10-27 MED ORDER — DEXAMETHASONE SODIUM PHOSPHATE 10 MG/ML IJ SOLN
INTRAMUSCULAR | Status: AC
  Filled 2014-10-27: qty 1

## 2014-10-27 MED ORDER — ONDANSETRON HCL 4 MG/2ML IJ SOLN
INTRAMUSCULAR | Status: DC | PRN
  Administered 2014-10-27: 4 mg via INTRAVENOUS

## 2014-10-27 MED ORDER — PHENYLEPHRINE HCL 10 MG/ML IJ SOLN
INTRAMUSCULAR | Status: DC | PRN
  Administered 2014-10-27: 80 ug via INTRAVENOUS

## 2014-10-27 SURGICAL SUPPLY — 23 items
CANISTER SUCT 3000ML (MISCELLANEOUS) ×2 IMPLANT
CATH ROBINSON RED A/P 16FR (CATHETERS) ×2 IMPLANT
CLOTH BEACON ORANGE TIMEOUT ST (SAFETY) ×2 IMPLANT
CONTAINER PREFILL 10% NBF 60ML (FORM) ×4 IMPLANT
DEVICE MYOSURE CLASSIC (MISCELLANEOUS) IMPLANT
DEVICE MYOSURE LITE (MISCELLANEOUS) IMPLANT
DILATOR CANAL MILEX (MISCELLANEOUS) ×2 IMPLANT
ELECT REM PT RETURN 9FT ADLT (ELECTROSURGICAL)
ELECTRODE REM PT RTRN 9FT ADLT (ELECTROSURGICAL) IMPLANT
FILTER ARTHROSCOPY CONVERTOR (FILTER) ×2 IMPLANT
GLOVE BIOGEL PI IND STRL 7.0 (GLOVE) ×1 IMPLANT
GLOVE BIOGEL PI INDICATOR 7.0 (GLOVE) ×1
GLOVE ECLIPSE 6.5 STRL STRAW (GLOVE) ×4 IMPLANT
GOWN STRL REUS W/TWL LRG LVL3 (GOWN DISPOSABLE) ×4 IMPLANT
MYOSURE XL FIBROID REM (MISCELLANEOUS)
PACK VAGINAL MINOR WOMEN LF (CUSTOM PROCEDURE TRAY) ×2 IMPLANT
PAD OB MATERNITY 4.3X12.25 (PERSONAL CARE ITEMS) ×2 IMPLANT
SEAL ROD LENS SCOPE MYOSURE (ABLATOR) ×2 IMPLANT
SYSTEM TISS REMOVAL MYSR XL RM (MISCELLANEOUS) IMPLANT
TOWEL OR 17X24 6PK STRL BLUE (TOWEL DISPOSABLE) ×4 IMPLANT
TUBING AQUILEX INFLOW (TUBING) ×2 IMPLANT
TUBING AQUILEX OUTFLOW (TUBING) ×2 IMPLANT
WATER STERILE IRR 1000ML POUR (IV SOLUTION) ×2 IMPLANT

## 2014-10-27 NOTE — Anesthesia Postprocedure Evaluation (Signed)
Anesthesia Post Note  Patient: Megan Graham  Procedure(s) Performed: Procedure(s) (LRB): DILATATION & CURETTAGE/HYSTEROSCOPY WITH RESECTOCOPE AND MYOSURE  (N/A)  Anesthesia type: General  Patient location: PACU  Post pain: Pain level controlled  Post assessment: Post-op Vital signs reviewed  Last Vitals:  Filed Vitals:   10/27/14 1000  BP: 123/63  Pulse: 86  Temp:   Resp: 10    Post vital signs: Reviewed  Level of consciousness: sedated  Complications: No apparent anesthesia complications

## 2014-10-27 NOTE — H&P (Signed)
Megan Graham is an 74 y.o. female G0 MWF here for hysteroscopy and polyp removal.  Ultrasound performed 09/16/14 showing uterus to measure 6 x 3.1 x 2.4cm with at least three polyps, procedure vs monitoring discussed.  Pt with hx of breast cancer so she has decided to proceed with definitive management.  Risks and benefits were discussed and documented in office note.    Pertinent Gynecological History: Menses: post-menopausal Bleeding: none Contraception: postmenopausal status DES exposure: denies Blood transfusions: none Sexually transmitted diseases: no past history Previous GYN Procedures: none  Last mammogram: normal Date: 10/15 Last pap: normal Date: 10/06/14 OB History: G0, P0   Menstrual History: Patient's last menstrual period was 02/07/1989.    Past Medical History  Diagnosis Date  . Glaucoma     low pressure  . Breast cancer     right-lumpectomy  . GERD (gastroesophageal reflux disease)   . Lymphedema of arm 04/2006    Right arm, Quit arimidex 07/2007  . Femoral hernia 1992  . Cellulitis of left arm     right arm not left   . PONV (postoperative nausea and vomiting)     primarily nausea     Past Surgical History  Procedure Laterality Date  . Breast lumpectomy  10/2001    lumpectomy, sentinel node bx, arimidex  . Bunionectomy  1980  . Femoral hernia repair      Family History  Problem Relation Age of Onset  . Hypertension Mother   . Osteoporosis Mother   . Asthma Brother   . Emphysema Brother     Social History:  reports that she has never smoked. She has never used smokeless tobacco. She reports that she does not drink alcohol or use illicit drugs.  Allergies:  Allergies  Allergen Reactions  . Tape Dermatitis    Paper tape ok  . Codeine Nausea And Vomiting  . Sulfa Antibiotics Rash    Prescriptions prior to admission  Medication Sig Dispense Refill Last Dose  . atorvastatin (LIPITOR) 10 MG tablet Take 10 mg by mouth daily.    10/26/2014 at Unknown  time  . calcium citrate-vitamin D (CITRACAL+D) 315-200 MG-UNIT per tablet Take 1 tablet by mouth daily.     . Cholecalciferol (VITAMIN D) 2000 UNITS CAPS Take 2,000 Units by mouth daily.   Taking  . Coenzyme Q10 (CO Q 10 PO) Take 1 capsule by mouth daily.    Taking  . latanoprost (XALATAN) 0.005 % ophthalmic solution Place 1 drop into both eyes at bedtime.    Taking  . Multiple Vitamins-Minerals (CENTRUM SILVER PO) Take 1 tablet by mouth daily.    Taking  . timolol (TIMOPTIC) 0.5 % ophthalmic solution Place 1 drop into both eyes 2 (two) times daily.    Taking  . fluconazole (DIFLUCAN) 150 MG tablet Take one tablet.  Repeat in 72 hours if symptoms are not completely resolved. (Patient not taking: Reported on 09/17/2014) 2 tablet 0 Not Taking  . terconazole (TERAZOL 7) 0.4 % vaginal cream Place 1 applicator vaginally at bedtime. For 7 days (Patient not taking: Reported on 09/17/2014) 45 g 0 Not Taking    Review of Systems  All other systems reviewed and are negative.   Blood pressure 135/69, pulse 87, temperature 98.4 F (36.9 C), temperature source Oral, resp. rate 20, last menstrual period 02/07/1989, SpO2 99 %. Physical Exam  Constitutional: She is oriented to person, place, and time. She appears well-developed and well-nourished.  Cardiovascular: Normal rate and regular rhythm.  Respiratory: Effort normal and breath sounds normal.  Neurological: She is alert and oriented to person, place, and time.  Skin: Skin is warm and dry.  Psychiatric: She has a normal mood and affect.    No results found for this or any previous visit (from the past 24 hour(s)).  No results found.  Assessment/Plan: 74 yo G0 MWF with thickened endometrium and at least three endometrial polyps here for hysteroscopy, polyp resection, D&C.  All questions answered and pt ready to proceed.  [name] 10/27/2014, 7:17 AM

## 2014-10-27 NOTE — Op Note (Signed)
10/27/2014  9:25 AM  PATIENT:  Megan Graham  74 y.o. female  PRE-OPERATIVE DIAGNOSIS:  endometrial polyps, thickened endometrium, history of breast cancer  POST-OPERATIVE DIAGNOSIS:  endometrial polyps, thickened endometrium, history of breast cancer  PROCEDURE:  Procedure(s): DILATATION & CURETTAGE/HYSTEROSCOPE  SURGEON:  MILLER, MARY SUZANNE  ASSISTANTS: OR staff   ANESTHESIA:   general  ESTIMATED BLOOD LOSS: 10cc  BLOOD ADMINISTERED:none   FLUIDS: 1200cc LR  UOP: pt voided just before going back to OR  SPECIMEN:  Endometrial currettings  DISPOSITION OF SPECIMEN:  PATHOLOGY  FINDINGS: endometrial adhesions, thin endometrium, no clear polyps noted  DESCRIPTION OF OPERATION: Patient was taken to the operating room.  She is placed in the supine position. SCDs were on her lower extremities and functioning properly. General anesthesia with an LMA was administered without difficulty.  Legs were then placed in the [place] in the low lithotomy position. The legs were lifted to the high lithotomy position and the Betadine prep was used on the inner thighs perineum and vagina x3. Patient was draped in a normal standard fashion.  No in and out cath was performed as pt voided just before going back to the OR.  The anterior lip of the cervix was grasped with single-tooth tenaculum.  A paracervical block of 1% lidocaine mixed one-to-one with epinephrine (1:100,000 units).  10 cc was used total. The cervix is dilated up to #21 [facility] dilators. The endometrial cavity sounded to 6cm.   A 2.9 millimeter Myosure hysteroscope was obtained.  Despite multiple attempts, it could not be passed through the cervix.  There was a tight area within the endocervical canal that I could not get a #23 Pratt dilator to pass through.  Attempts also made with hagar dilators but I could not get anything larger than a #6 to pass.  At this point the diagnostic 2.25mm hysteroscope was obtained and passed into  the endocervical canal.  This passed easily into the endometrial cavity. The tubal ostia were noted bilaterally. Scarring with synechia was noted but no clear polyps were noted.  Then a #1 toothed curette was used to curette the cavity until rough rough gritty texture was noted in all quadrants. With revisualization of the hysteroscope this was all removed.  At this point, the procedure was ended.  The hysteroscope was reemoved. The fluid deficit was 110 cc. The tenaculum was removed from the anterior lip of the cervix. The speculum was removed from the vagina. The prep was cleansed of the patient's skin. The legs are positioned back in the supine position. Sponge, lap, needle, initially counts were correct x2. Patient was taken to recovery in stable condition.  COUNTS:  YES  PLAN OF CARE: Transfer to PACU

## 2014-10-27 NOTE — Anesthesia Preprocedure Evaluation (Signed)
Anesthesia Evaluation  Patient identified by MRN, date of birth, ID band Patient awake    Reviewed: Allergy & Precautions, H&P , NPO status , Patient's Chart, lab work & pertinent test results  Airway Mallampati: I  TM Distance: >3 FB Neck ROM: full    Dental no notable dental hx. (+) Teeth Intact   Pulmonary neg pulmonary ROS,    Pulmonary exam normal        Cardiovascular negative cardio ROS Normal cardiovascular exam     Neuro/Psych negative neurological ROS  negative psych ROS   GI/Hepatic Neg liver ROS,   Endo/Other  negative endocrine ROS  Renal/GU negative Renal ROS     Musculoskeletal   Abdominal Normal abdominal exam  (+)   Peds  Hematology negative hematology ROS (+)   Anesthesia Other Findings   Reproductive/Obstetrics negative OB ROS                             Anesthesia Physical Anesthesia Plan  ASA: II  Anesthesia Plan: General   Post-op Pain Management:    Induction: Intravenous  Airway Management Planned: LMA  Additional Equipment:   Intra-op Plan:   Post-operative Plan:   Informed Consent: I have reviewed the patients History and Physical, chart, labs and discussed the procedure including the risks, benefits and alternatives for the proposed anesthesia with the patient or authorized representative who has indicated his/her understanding and acceptance.     Plan Discussed with: CRNA and Surgeon  Anesthesia Plan Comments:         Anesthesia Quick Evaluation

## 2014-10-27 NOTE — Discharge Instructions (Signed)
DISCHARGE INSTRUCTIONS: D&C/HYSTEROSCOPY  The following instructions have been prepared to help you care for yourself upon your return home.  **You may take additional ibuprofen/motrin/advil after 3:00 pm today**  Personal hygiene:  Use sanitary pads for vaginal drainage, not tampons.  Shower the day after your procedure.  NO tub baths, pools or Jacuzzis for 2-3 weeks.  Wipe front to back after using the bathroom.  Activity and limitations:  Do NOT drive or operate any equipment for 24 hours. The effects of anesthesia are still present and drowsiness may result.  Do NOT rest in bed all day.  Walking is encouraged.  Walk up and down stairs slowly.  You may resume your normal activity in one to two days or as indicated by your physician. Sexual activity: NO intercourse for at least 2 weeks after the procedure, or as indicated by your Doctor.  Diet: Eat a light meal as desired this evening. You may resume your usual diet tomorrow.  Return to Work: You may resume your work activities in one to two days or as indicated by Marine scientist.  What to expect after your surgery: Expect to have vaginal bleeding/discharge for 2-3 days and spotting for up to 10 days. It is not unusual to have soreness for up to 1-2 weeks. You may have a slight burning sensation when you urinate for the first day. Mild cramps may continue for a couple of days. You may have a regular period in 2-6 weeks.  Call your doctor for any of the following:  Excessive vaginal bleeding or clotting, saturating and changing one pad every hour.  Inability to urinate 6 hours after discharge from hospital.  Pain not relieved by pain medication.  Fever of 100.4 F or greater.  Unusual vaginal discharge or odor.   Patients signature: ______________________  Nurses signature ________________________  Support person's signature_____________________

## 2014-10-27 NOTE — Transfer of Care (Signed)
Immediate Anesthesia Transfer of Care Note  Patient: Megan Graham  Procedure(s) Performed: Procedure(s): DILATATION & CURETTAGE/HYSTEROSCOPY WITH RESECTOCOPE AND MYOSURE  (N/A)  Patient Location: PACU  Anesthesia Type:General  Level of Consciousness: awake, alert  and oriented  Airway & Oxygen Therapy: Patient Spontanous Breathing and Patient connected to nasal cannula oxygen  Post-op Assessment: Report given to RN, Post -op Vital signs reviewed and stable and Patient moving all extremities X 4  Post vital signs: Reviewed and stable  Last Vitals:  Filed Vitals:   10/27/14 0705  BP: 135/69  Pulse: 87  Temp: 36.9 C  Resp: 20    Complications: No apparent anesthesia complications

## 2014-10-27 NOTE — Anesthesia Procedure Notes (Signed)
Procedure Name: LMA Insertion Date/Time: 10/27/2014 8:30 AM Performed by: ADELOYE, DAVID A Pre-anesthesia Checklist: Patient identified, Emergency Drugs available, Patient being monitored, Timeout performed and Suction available Patient Re-evaluated:Patient Re-evaluated prior to inductionOxygen Delivery Method: Circle system utilized Preoxygenation: Pre-oxygenation with 100% oxygen Intubation Type: IV induction Ventilation: Mask ventilation without difficulty LMA: LMA inserted LMA Size: 4.0 Number of attempts: 1 Tube secured with: Tape (Paper tape) Dental Injury: Teeth and Oropharynx as per pre-operative assessment

## 2014-10-28 ENCOUNTER — Encounter (HOSPITAL_COMMUNITY): Payer: Self-pay | Admitting: Obstetrics & Gynecology

## 2014-11-13 ENCOUNTER — Ambulatory Visit (INDEPENDENT_AMBULATORY_CARE_PROVIDER_SITE_OTHER): Payer: Medicare Other | Admitting: Obstetrics & Gynecology

## 2014-11-13 ENCOUNTER — Encounter: Payer: Self-pay | Admitting: Obstetrics & Gynecology

## 2014-11-13 VITALS — BP 102/70 | HR 80 | Resp 14 | Wt 150.0 lb

## 2014-11-13 DIAGNOSIS — R935 Abnormal findings on diagnostic imaging of other abdominal regions, including retroperitoneum: Secondary | ICD-10-CM

## 2014-11-13 NOTE — Progress Notes (Signed)
Patient ID: Megan Graham, female   DOB: [birth year], 74 y.o.   MRN: [id number] Post Operative Visit  Procedure:DILATATION & CURETTAGE/HYSTEROSCOPY WITH RESECTOCOPE AND MYOSURE  Days Post-op: 17 days   Subjective: Pt reports she has done very well.  Had spotting for a few days in the morning after her surgery.  She really didn't have much pain and only needed one Tylenol.  No bladder or bowel change.  She also denies fever.  Pictures and pathology reviewed.  Pt aware no clear polyp was seen and no polyp was present with pathology.  The areas that looked like polyps on ultrasound were areas of synechia (from unknown etiology).  Pt did have a cellulitis in her RUQ.  She has some mild lymphedema from prior breast cancer surgery.  She called her PCP who treated her over the weekend with abx.  Objective: BP 102/70 mmHg  Pulse 80  Resp 14  Wt 150 lb (68.04 kg)  LMP 02/07/1989  EXAM General: alert and cooperative Resp: clear to auscultation bilaterally Cardio: regular rate and rhythm, S1, S2 normal, no murmur, click, rub or gallop GI: soft, non-tender; bowel sounds normal; no masses,  no organomegaly Extremities: extremities normal, atraumatic, no cyanosis or edema Vaginal Bleeding: none  GYN:  NAEFG, vaginal pink without discharge or odor, cervix closed, no CMT  Assessment: s/p hysteroscopy with D&C Recent cellulitis in RUQ due to lymphedema (from lymph node removal with breast ca treatment.)  Plan: 1 year for AEX Rx for cephalexin 500mg  tid x 10 days for pt to have on hand for future cellulitis issues.

## 2014-11-16 ENCOUNTER — Encounter: Payer: Self-pay | Admitting: Obstetrics & Gynecology

## 2014-12-22 ENCOUNTER — Telehealth: Payer: Self-pay

## 2014-12-22 NOTE — Telephone Encounter (Signed)
Patient notified of [facility] BMD results. Copy will be scanned in epic.//kn 

## 2015-02-06 ENCOUNTER — Encounter: Payer: Self-pay | Admitting: Certified Nurse Midwife

## 2015-02-06 ENCOUNTER — Telehealth: Payer: Self-pay | Admitting: Obstetrics & Gynecology

## 2015-02-06 ENCOUNTER — Ambulatory Visit (INDEPENDENT_AMBULATORY_CARE_PROVIDER_SITE_OTHER): Payer: Medicare Other | Admitting: Certified Nurse Midwife

## 2015-02-06 VITALS — BP 116/78 | HR 80 | Resp 16 | Ht 63.5 in | Wt 150.0 lb

## 2015-02-06 DIAGNOSIS — N390 Urinary tract infection, site not specified: Secondary | ICD-10-CM | POA: Diagnosis not present

## 2015-02-06 MED ORDER — NITROFURANTOIN MONOHYD MACRO 100 MG PO CAPS: 100.0000 mg | ORAL_CAPSULE | Freq: Two times a day (BID) | ORAL | Status: DC

## 2015-02-06 NOTE — Patient Instructions (Signed)

## 2015-02-06 NOTE — Telephone Encounter (Signed)
Spoke with patient. She states that she has urinary frequency that started today. Feels as though she has a bladder infection. She reports pain at the end of her stream. No fevers, no flank pain, no hematuria. Office visit today with Melvia Heaps CNM scheduled. Patient agreeable.  Routing to provider for final review. Patient agreeable to disposition. Will close encounter.

## 2015-02-06 NOTE — Telephone Encounter (Signed)
Patient says she has a bacterial infection and would like to have something called in to [facility] at [phone].

## 2015-02-06 NOTE — Progress Notes (Signed)
74 yo g0p0 here with complaint of UTI, with onset  on in past 24 hours. Patient complaining of urinary frequency/urgency/ and pain with urination. Patient denies fever, chills, nausea or back pain. No new personal products. Patient feels not related to sexual activity. Denies any vaginal symptoms.  Menopausal with vaginal dryness. Patient not adequate water intake. Took Azo this am with some relief. Has tried to increase water. No other health issues today.   O: Healthy female WDWN Affect: Normal, orientation x 3 Skin : warm and dry CVAT: negative bilateral Abdomen: positive for suprapubic tenderness  Pelvic exam: External genital area: normal, no lesions Bladder,Urethra tender, Urethral meatus: tender, red Vagina: normal vaginal discharge, normal appearance   Cervix: normal, non tender Uterus:normal,non tender Adnexa: normal non tender, no fullness or masses   A: UTI Normal pelvic exam Poct urine-unable to dip,pt took azo P: Reviewed findings of UTI and need for treatment. [id number] see order Reviewed warning signs and symptoms of UTI and need to advise if occurring. Encouraged to limit soda, tea, and coffee and be sure to increase water intake. May continue AZO prn for dysuria.   RV prn

## 2015-02-07 NOTE — Progress Notes (Signed)
Reviewed personally.  M. Suzanne Aeneas Longsworth, MD.  

## 2015-02-27 ENCOUNTER — Ambulatory Visit (INDEPENDENT_AMBULATORY_CARE_PROVIDER_SITE_OTHER): Payer: Medicare Other | Admitting: Obstetrics & Gynecology

## 2015-02-27 ENCOUNTER — Encounter: Payer: Self-pay | Admitting: Obstetrics & Gynecology

## 2015-02-27 VITALS — BP 124/82 | HR 64 | Resp 16 | Wt 151.2 lb

## 2015-02-27 DIAGNOSIS — Z01419 Encounter for gynecological examination (general) (routine) without abnormal findings: Secondary | ICD-10-CM

## 2015-02-27 LAB — POCT URINALYSIS DIPSTICK
BILIRUBIN UA: NEGATIVE
Glucose, UA: NEGATIVE
Ketones, UA: NEGATIVE
Leukocytes, UA: NEGATIVE
NITRITE UA: NEGATIVE
PH UA: 6.5
Protein, UA: NEGATIVE
RBC UA: NEGATIVE
UROBILINOGEN UA: NEGATIVE

## 2015-02-27 NOTE — Progress Notes (Deleted)
75 y.o. G0P0000 Married{Race/ethnicity:17218}F here for annual exam.    Patient's last menstrual period was 02/07/1989.          Sexually active: {yes no:314532}  The current method of family planning is {contraception:315051}.    Exercising: {yes no:314532}  {types:19826} Smoker:  {YES [id number]  Health Maintenance: Pap:  10/06/14 Neg History of abnormal Pap:  no MMG: 12/08/14 BIRADS2:benign  Colonoscopy:  12/2007? Diverticulosis  BMD:   12/08/14 Mild Osteopenia  TDaP:  2014  Screening Labs: ***, Hb today: ***, Urine today: ***   reports that she has never smoked. She has never used smokeless tobacco. She reports that she does not drink alcohol or use illicit drugs.  Past Medical History  Diagnosis Date  . Glaucoma     low pressure  . Breast cancer ([place])     right-lumpectomy  . GERD (gastroesophageal reflux disease)   . Lymphedema of arm 04/2006    Right arm, Quit arimidex 07/2007  . Femoral hernia 1992  . Cellulitis of left arm     right arm not left   . PONV (postoperative nausea and vomiting)     primarily nausea     Past Surgical History  Procedure Laterality Date  . Breast lumpectomy  10/2001    lumpectomy, sentinel node bx, arimidex  . Bunionectomy  1980  . Femoral hernia repair    . Dilatation & curettage/hysteroscopy with myosure N/A 10/27/2014    Procedure: DILATATION & CURETTAGE/HYSTEROSCOPY WITH RESECTOCOPE AND MYOSURE ;  Surgeon: Megan Salon, MD;  Location: [facility];  Service: Gynecology;  Laterality: N/A;    Current Outpatient Prescriptions  Medication Sig Dispense Refill  . atorvastatin (LIPITOR) 10 MG tablet Take 10 mg by mouth daily.     . Calcium Carbonate-Vitamin D (CALCIUM PLUS VITAMIN D PO) Take by mouth daily.    . Cholecalciferol (VITAMIN D) 2000 UNITS CAPS Take 2,000 Units by mouth daily.    . Coenzyme Q10 (CO Q 10 PO) Take 1 capsule by mouth daily. 100mg     . latanoprost (XALATAN) 0.005 % ophthalmic solution Place 1 drop into both eyes at  bedtime.    . Multiple Vitamins-Minerals (CENTRUM SILVER PO) Take 1 tablet by mouth daily.     . timolol (TIMOPTIC) 0.5 % ophthalmic solution Place 1 drop into both eyes 2 (two) times daily.      No current facility-administered medications for this visit.    Family History  Problem Relation Age of Onset  . Hypertension Mother   . Osteoporosis Mother   . Asthma Brother   . Emphysema Brother     ROS:  Pertinent items are noted in HPI.  Otherwise, a comprehensive ROS was negative.  Exam:   BP 124/82 mmHg  Pulse 64  Resp 16  Wt 151 lb 3.2 oz (68.584 kg)  LMP 02/07/1989  Weight change: @WEIGHTCHANGE @ Height:      Ht Readings from Last 3 Encounters:  02/06/15 5' 3.5" (1.613 m)  10/23/14 5' 3.5" (1.613 m)  09/02/14 5\' 4"  (1.626 m)    General appearance: alert, cooperative and appears stated age Head: Normocephalic, without obvious abnormality, atraumatic Neck: no adenopathy, supple, symmetrical, trachea midline and thyroid {EXAM; THYROID:18604} Lungs: clear to auscultation bilaterally Breasts: {Exam; breast:13139::"normal appearance, no masses or tenderness"} Heart: regular rate and rhythm Abdomen: soft, non-tender; bowel sounds normal; no masses,  no organomegaly Extremities: extremities normal, atraumatic, no cyanosis or edema Skin: Skin color, texture, turgor normal. No rashes or lesions Lymph nodes: Cervical, supraclavicular,  and axillary nodes normal. No abnormal inguinal nodes palpated Neurologic: Grossly normal   Pelvic: External genitalia:  no lesions              Urethra:  normal appearing urethra with no masses, tenderness or lesions              Bartholins and Skenes: normal                 Vagina: normal appearing vagina with normal color and discharge, no lesions              Cervix: {exam; cervix:14595}              Pap taken: {yes no:314532} Bimanual Exam:  Uterus:  {exam; uterus:12215}              Adnexa: {exam; adnexa:12223}               Rectovaginal:  Confirms               Anus:  normal sphincter tone, no lesions  Chaperone was present for exam.  A:  Well Woman with normal exam  P:   {plan; gyn:5269::"mammogram","pap smear","return annually or prn"}

## 2015-02-27 NOTE — Progress Notes (Signed)
75 y.o. G0P0000 MarriedCaucasianF here for annual exam.  She is doing well.  Denies vaginal bleeding.   PCP:  Dr. Felipa Eth.    Patient's last menstrual period was 02/07/1989.          Sexually active: No.  The current method of family planning is post menopausal status.    Exercising: No.  Spin 2x week, Strength balance 3 x a week, walking Smoker:  no  Health Maintenance: Pap: 10/06/2014 WNL History of abnormal Pap:  no MMG:  12/08/2014 BIRADS Category 2 benign Colonoscopy:  11/16/07 Normal, [facility] BMD:  12/08/2014 Osteopenia, -1.3 TDaP:  02/08/2012 Screening Labs: PCP, Hb today: PCP, Urine today: Negative   reports that she has never smoked. She has never used smokeless tobacco. She reports that she does not drink alcohol or use illicit drugs.  Past Medical History  Diagnosis Date  . Glaucoma     low pressure  . Breast cancer ([place])     right-lumpectomy  . GERD (gastroesophageal reflux disease)   . Lymphedema of arm 04/2006    Right arm, Quit arimidex 07/2007  . Femoral hernia 1992  . Cellulitis of left arm     right arm not left   . PONV (postoperative nausea and vomiting)     primarily nausea     Past Surgical History  Procedure Laterality Date  . Breast lumpectomy  10/2001    lumpectomy, sentinel node bx, arimidex  . Bunionectomy  1980  . Femoral hernia repair    . Dilatation & curettage/hysteroscopy with myosure N/A 10/27/2014    Procedure: DILATATION & CURETTAGE/HYSTEROSCOPY WITH RESECTOCOPE AND MYOSURE ;  Surgeon: Megan Salon, MD;  Location: [facility];  Service: Gynecology;  Laterality: N/A;    Current Outpatient Prescriptions  Medication Sig Dispense Refill  . atorvastatin (LIPITOR) 10 MG tablet Take 10 mg by mouth daily.     . Calcium Carbonate-Vitamin D (CALCIUM PLUS VITAMIN D PO) Take by mouth daily.    . Cholecalciferol (VITAMIN D) 2000 UNITS CAPS Take 2,000 Units by mouth daily.    . Coenzyme Q10 (CO Q 10 PO) Take 1 capsule by mouth daily. 100mg     .  latanoprost (XALATAN) 0.005 % ophthalmic solution Place 1 drop into both eyes at bedtime.    . Multiple Vitamins-Minerals (CENTRUM SILVER PO) Take 1 tablet by mouth daily.     . timolol (TIMOPTIC) 0.5 % ophthalmic solution Place 1 drop into both eyes 2 (two) times daily.      No current facility-administered medications for this visit.    Family History  Problem Relation Age of Onset  . Hypertension Mother   . Osteoporosis Mother   . Asthma Brother   . Emphysema Brother     ROS:  Pertinent items are noted in HPI.  Otherwise, a comprehensive ROS was negative.  Exam:   BP 124/82 mmHg  Pulse 64  Resp 16  Wt 151 lb 3.2 oz (68.584 kg)  LMP 02/07/1989  Weight change: +6#      Ht Readings from Last 3 Encounters:  02/06/15 5' 3.5" (1.613 m)  10/23/14 5' 3.5" (1.613 m)  09/02/14 5\' 4"  (1.626 m)    General appearance: alert, cooperative and appears stated age Head: Normocephalic, without obvious abnormality, atraumatic Neck: no adenopathy, supple, symmetrical, trachea midline and thyroid normal to inspection and palpation Lungs: clear to auscultation bilaterally Breasts: normal appearance, no masses or tenderness, well healed right axillary and breast scar, minimal radiation changes on right breast Heart:  regular rate and rhythm Abdomen: soft, non-tender; bowel sounds normal; no masses,  no organomegaly Extremities: extremities normal, atraumatic, no cyanosis or edema Skin: Skin color, texture, turgor normal. No rashes or lesions Lymph nodes: Cervical, supraclavicular, and axillary nodes normal. No abnormal inguinal nodes palpated Neurologic: Grossly normal   Pelvic: External genitalia:  no lesions              Urethra:  normal appearing urethra with no masses, tenderness or lesions              Bartholins and Skenes: normal                 Vagina: normal appearing vagina with normal color and discharge, no lesions              Cervix: no lesions              Pap taken:  No. Bimanual Exam:  Uterus:  normal size, contour, position, consistency, mobility, non-tender              Adnexa: normal adnexa and no mass, fullness, tenderness               Rectovaginal: Confirms               Anus:  normal sphincter tone, no lesions  Chaperone was present for exam.  A:  Well woman exam PMP, no HRT H/O breast cancer, 10/2001, s/p lumpectomy sentinel node biopsy, treated with chemo and radiation and then five years of arimidex.  No with right arm lymphedema Elevated lipids, on lipitor S/p polyp resection 9/16  P: Mammogram yearly, doing 3D Labs with Dr. Felipa Eth in February Had pap 8/16 with PMP bleeding episode.  No pap today. AEX 1 year or follow up prn.

## 2015-03-23 ENCOUNTER — Other Ambulatory Visit: Payer: Self-pay | Admitting: Orthopedic Surgery

## 2015-03-23 DIAGNOSIS — M25561 Pain in right knee: Secondary | ICD-10-CM

## 2015-03-28 ENCOUNTER — Ambulatory Visit
Admission: RE | Admit: 2015-03-28 | Discharge: 2015-03-28 | Disposition: A | Discharge status: Home / Self Care | Payer: Medicare Other | Source: Ambulatory Visit | Attending: Orthopedic Surgery | Admitting: Orthopedic Surgery

## 2015-03-28 DIAGNOSIS — M25561 Pain in right knee: Secondary | ICD-10-CM

## 2015-10-02 ENCOUNTER — Encounter: Payer: Self-pay | Admitting: Obstetrics & Gynecology

## 2015-10-02 ENCOUNTER — Ambulatory Visit: Payer: Medicare Other | Admitting: Obstetrics & Gynecology

## 2015-10-02 VITALS — BP 102/70 | HR 76 | Resp 16 | Ht 63.5 in | Wt 148.0 lb

## 2015-10-02 DIAGNOSIS — R350 Frequency of micturition: Secondary | ICD-10-CM

## 2015-10-02 DIAGNOSIS — N390 Urinary tract infection, site not specified: Secondary | ICD-10-CM | POA: Diagnosis not present

## 2015-10-02 DIAGNOSIS — N3000 Acute cystitis without hematuria: Secondary | ICD-10-CM | POA: Diagnosis not present

## 2015-10-02 LAB — POCT URINALYSIS DIPSTICK
BILIRUBIN UA: NEGATIVE
GLUCOSE UA: NEGATIVE
KETONES UA: NEGATIVE
Nitrite, UA: POSITIVE
Protein, UA: NEGATIVE
Urobilinogen, UA: NEGATIVE
pH, UA: 5

## 2015-10-02 LAB — BASIC METABOLIC PANEL
BUN: 16 mg/dL (ref 7–25)
CALCIUM: 9.1 mg/dL (ref 8.6–10.4)
CO2: 25 mmol/L (ref 20–31)
Chloride: 104 mmol/L (ref 98–110)
Creat: 0.9 mg/dL (ref 0.60–0.93)
GLUCOSE: 93 mg/dL (ref 65–99)
Potassium: 4.8 mmol/L (ref 3.5–5.3)
Sodium: 141 mmol/L (ref 135–146)

## 2015-10-02 MED ORDER — NITROFURANTOIN MONOHYD MACRO 100 MG PO CAPS: 100.0000 mg | ORAL_CAPSULE | Freq: Two times a day (BID) | ORAL | 0 refills | Status: DC

## 2015-10-02 NOTE — Progress Notes (Signed)
S:  75 y.o.  MWF here for two day hx of dysuria.  Pt reports she didn't drink much water on Tuesday and she says "I did this to myself".  She started Azo standard and yesterday her symptoms were improved.  She is not having fever.  Denies back pain.  Associated symptoms are urgency, incontinence, and frequency.  She has not seen blood in her urine.  Pt also has noted an abdominal wall mass that she is concerned about and wants me to look at today.  H/O breast cancer.  Has friend who was diagnosed with cancer and died in a few weeks.  Reports "they knew something was wrong" but couldn't figure out what it was.  Turned out to be a mass behind her stomach and "now she's gone".  Pt feels like she and her husband have been to so many funerals this past year.  Abd mass is causing so much angst for her.  Just wants to know definitively what it is.  Mass is non tender but does seem to be getting larger.  No nausea, vomiting, diarrhea, constipation.  No recent weight loss.  No fevers.  Past Medical History:  Diagnosis Date  . Breast cancer ([place])    right-lumpectomy  . Cellulitis of left arm    right arm not left   . Femoral hernia 1992  . GERD (gastroesophageal reflux disease)   . Glaucoma    low pressure  . Lymphedema of arm 04/2006   Right arm, Quit arimidex 07/2007  . PONV (postoperative nausea and vomiting)    primarily nausea    Current Outpatient Prescriptions on File Prior to Visit  Medication Sig Dispense Refill  . atorvastatin (LIPITOR) 10 MG tablet Take 10 mg by mouth daily.     . Calcium Carbonate-Vitamin D (CALCIUM PLUS VITAMIN D PO) Take by mouth daily.    . Cholecalciferol (VITAMIN D) 2000 UNITS CAPS Take 2,000 Units by mouth daily.    . Coenzyme Q10 (CO Q 10 PO) Take 1 capsule by mouth daily. 100mg     . Multiple Vitamins-Minerals (CENTRUM SILVER PO) Take 1 tablet by mouth daily.     . timolol (TIMOPTIC) 0.5 % ophthalmic solution Place 1 drop into both eyes 2 (two) times daily.     Marland Kitchen  latanoprost (XALATAN) 0.005 % ophthalmic solution Place 1 drop into both eyes at bedtime.     No current facility-administered medications on file prior to visit.    Allergies  Allergen Reactions  . Tape Dermatitis    Paper tape ok  . Codeine Nausea And Vomiting  . Nsaids   . Meloxicam Rash  . Sulfa Antibiotics Rash   Review of Systems  All other systems reviewed and are negative.   O:  Vitals:   10/02/15 0943  BP: 102/70  Pulse: 76  Resp: 16  Weight: 148 lb (67.1 kg)  Height: 5' 3.5" (1.613 m)   Physical Exam  Constitutional: She appears well-developed and well-nourished.  Cardiovascular: Normal rate and regular rhythm.   Pulmonary/Chest: Effort normal and breath sounds normal.  No CVA tenderness  Abdominal: Soft. She exhibits no distension. There is no tenderness (no suprapubic tenderness).    Genitourinary: Vagina normal. There is no rash, tenderness, lesion or injury on the right labia. There is no rash, tenderness, lesion or injury on the left labia.  Lymphadenopathy:       Right: No inguinal adenopathy present.       Left: No inguinal adenopathy present.  Skin: Skin is warm and dry.  Psychiatric: She has a normal mood and affect.    Assessment: UTI Abdominal protuberance/wall mass  Plan:   Urine micro and culture CMP for renal function Will schedule CT of abdomen and pelvis  ~30 minutes spent with patient >50% of time was in face to face discussion of above. Pt very anxious.  Reassurance given that this is likely benign.  She just needs to know "for sure".

## 2015-10-03 LAB — URINALYSIS, MICROSCOPIC ONLY
Casts: NONE SEEN [LPF]
Crystals: NONE SEEN [HPF]
RBC / HPF: NONE SEEN RBC/HPF (ref <=2)
Squamous Epithelial / LPF: NONE SEEN [HPF] (ref <=5)
YEAST: NONE SEEN [HPF]

## 2015-10-05 LAB — URINE CULTURE

## 2015-10-06 ENCOUNTER — Telehealth: Payer: Self-pay

## 2015-10-06 DIAGNOSIS — R222 Localized swelling, mass and lump, trunk: Secondary | ICD-10-CM

## 2015-10-06 NOTE — Telephone Encounter (Signed)
Order placed for Ct Abdomen/Pelvis with contrast for evaluation of abdominal wall mass per verbal order from [facility]. Spoke with patient who prefers to be scheduled on a Monday morning. Not available on 10/19/2015. Advised I will contact [facility] and return call with appointment date and time. She is agreeable.  Spoke with International aid/development worker at [facility]. Appointment scheduled for 10/26/2015 at 10:40 am at [facility] location. Patient will need to be NPO for 4 hours prior to her appointment. Will need to pick up contrast from imaging location. Will drink the first bottle of contrast at 9 am and second bottle of contrast at 10 am.  Spoke with patient. Advised of appointment date, time, location, and instructions. She is agreeable and verbalizes understanding. She will go to [facility] to pick up contrast material before the date of her appointment. Order has been placed for precert. Patient has been placed in imaging hold.  Cc: Lerry Liner  Routing to provider for final review. Patient agreeable to disposition. Will close encounter.

## 2015-10-06 NOTE — Telephone Encounter (Signed)
Spoke with patient at time of incoming call. Patient states that she has checked her calendar and will need to reschedule her CT scan that is scheduled for 10/26/2015. States that she can move an appointment on 10/19/2015 to have this performed or could be seen on 10/16/2015.  If these dates are not available she requests a Monday morning appointment. Advised I will contact [facility] to reschedule appoint and return call. She is agreeable.  Call to [facility]. Appointment rescheduled to 10/19/2015 at 9:30 am with 9:10 am arrival at [facility]. Patient will need to be NPO for 4 hours prior to her appointment. Will need to pick up contrast from imaging location. Will drink the first bottle of contrast at 7:30 am and second bottle of contrast at 8:30 am.  Patient is agreeable to new date and time for appointment as listed above. She will go to [facility] to pick up contrast material this week. Order was previously placed for precert and the patient will remain in imaging hold.  Routing to provider for final review. Patient agreeable to disposition. Will close encounter.

## 2015-10-07 NOTE — Addendum Note (Signed)
Addended by: Megan Salon on: 10/07/2015 11:05 AM   Modules accepted: Orders

## 2015-10-19 ENCOUNTER — Ambulatory Visit
Admission: RE | Admit: 2015-10-19 | Discharge: 2015-10-19 | Disposition: A | Discharge status: Home / Self Care | Payer: Medicare Other | Source: Ambulatory Visit | Attending: Obstetrics & Gynecology | Admitting: Obstetrics & Gynecology

## 2015-10-19 DIAGNOSIS — R222 Localized swelling, mass and lump, trunk: Secondary | ICD-10-CM

## 2015-10-19 MED ORDER — IOPAMIDOL (ISOVUE-300) INJECTION 61%
100.0000 mL | Freq: Once | INTRAVENOUS | Status: AC | PRN
  Administered 2015-10-19: 100 mL via INTRAVENOUS

## 2015-10-22 ENCOUNTER — Telehealth: Payer: Self-pay

## 2015-10-22 NOTE — Telephone Encounter (Signed)
-----   Message from Megan Salon, MD sent at 10/21/2015 10:58 AM EDT ----- Please let pt know her CT did not show any abdominal wall abnormalities, just fatty tissue.  She does have some small kidney and liver cysts but these are very small and benign and do not need any follow up.  She has some diverticulosis in the colon.  This is just for her information.  She has some minimal atherosclerosis in the aorta.  This does not need any specific follow-up and would be expected at age 75.  Overall, this looks good.  If she has a lot of questions, please just schedule OV for results discussion.  Thanks.

## 2015-10-22 NOTE — Telephone Encounter (Signed)
Spoke with patient. Advised of message and results as seen below from [place]. Patient is agreeable and verbalizes understanding. Patient requests that CT results be sent to Dr.Stoneking who is her PCP so that he will have these on file if needed. Verbal request for release of medical records completed and to the front desk for fax of CT scan to Dr.Stoneking at [phone].  Routing to provider for final review. Patient agreeable to disposition. Will close encounter.

## 2015-10-23 ENCOUNTER — Ambulatory Visit (INDEPENDENT_AMBULATORY_CARE_PROVIDER_SITE_OTHER): Payer: Medicare Other

## 2015-10-23 DIAGNOSIS — N3000 Acute cystitis without hematuria: Secondary | ICD-10-CM | POA: Diagnosis not present

## 2015-10-23 NOTE — Progress Notes (Signed)
Patient here for TOC urine culture. Patient is doing much better with no complaints. Culture has been drawn up and sent to the lab for resulting.   Encounter closed and routed to provider.

## 2015-10-25 LAB — URINE CULTURE: ORGANISM ID, BACTERIA: NO GROWTH

## 2015-10-26 ENCOUNTER — Other Ambulatory Visit: Payer: Medicare Other

## 2015-11-06 ENCOUNTER — Telehealth: Payer: Self-pay | Admitting: Obstetrics & Gynecology

## 2015-11-06 NOTE — Telephone Encounter (Signed)
Made in error

## 2015-12-08 ENCOUNTER — Encounter: Payer: Self-pay | Admitting: Diagnostic Neuroimaging

## 2015-12-08 ENCOUNTER — Ambulatory Visit (INDEPENDENT_AMBULATORY_CARE_PROVIDER_SITE_OTHER): Payer: Medicare Other | Admitting: Diagnostic Neuroimaging

## 2015-12-08 ENCOUNTER — Ambulatory Visit (INDEPENDENT_AMBULATORY_CARE_PROVIDER_SITE_OTHER): Payer: Self-pay

## 2015-12-08 ENCOUNTER — Telehealth: Payer: Self-pay | Admitting: Diagnostic Neuroimaging

## 2015-12-08 VITALS — BP 128/79 | HR 73 | Ht 63.5 in | Wt 150.0 lb

## 2015-12-08 DIAGNOSIS — R29898 Other symptoms and signs involving the musculoskeletal system: Secondary | ICD-10-CM | POA: Diagnosis not present

## 2015-12-08 DIAGNOSIS — Z0289 Encounter for other administrative examinations: Secondary | ICD-10-CM

## 2015-12-08 DIAGNOSIS — G459 Transient cerebral ischemic attack, unspecified: Secondary | ICD-10-CM | POA: Diagnosis not present

## 2015-12-08 DIAGNOSIS — M2142 Flat foot [pes planus] (acquired), left foot: Secondary | ICD-10-CM | POA: Diagnosis not present

## 2015-12-08 NOTE — Telephone Encounter (Signed)
[facility] Patient ready to be scheduled for Doppler. No PA needed. Thanks Hinton Dyer

## 2015-12-08 NOTE — Patient Instructions (Signed)
Thank you for coming to see [place] at [facility]. I hope we have been able to provide you high quality care today.  You may receive a patient satisfaction survey over the next few weeks. We would appreciate your feedback and comments so that we may continue to improve ourselves and the health of our patients.   - I will check MRI brain, MRA head  - I will check echocardiogram and carotid ultrasound   ~~~~~~~~~~~~~~~~~~~~~~~~~~~~~~~~~~~~~~~~~~~~~~~~~~~~~~~~~~~~~~~~~  DR. Jesstin Studstill'S GUIDE TO HAPPY AND HEALTHY LIVING These are some of my general health and wellness recommendations. Some of them may apply to you better than others. Please use common sense as you try these suggestions and feel free to ask me any questions.   ACTIVITY/FITNESS Mental, social, emotional and physical stimulation are very important for brain and body health. Try learning a new activity (arts, music, language, sports, games).  Keep moving your body to the best of your abilities. You can do this at home, inside or outside, the park, community center, gym or anywhere you like. Consider a physical therapist or personal trainer to get started. Consider the app Sworkit. Fitness trackers such as smart-watches, smart-phones or Fitbits can help as well.   NUTRITION Eat more plants: colorful vegetables, nuts, seeds and berries.  Eat less sugar, salt, preservatives and processed foods.  Avoid toxins such as cigarettes and alcohol.  Drink water when you are thirsty. Warm water with a slice of lemon is an excellent morning drink to start the day.  Consider these websites for more information The Nutrition Source (https://www.henry-hernandez.biz/) Precision Nutrition (WindowBlog.ch)   RELAXATION Consider practicing mindfulness meditation or other relaxation techniques such as deep breathing, prayer, yoga, tai chi, massage. See website mindful.org or the apps  Headspace or Calm to help get started.   SLEEP Try to get at least 7-8+ hours sleep per day. Regular exercise and reduced caffeine will help you sleep better. Practice good sleep hygeine techniques. See website sleep.org for more information.   PLANNING Prepare estate planning, living will, healthcare POA documents. Sometimes this is best planned with the help of an attorney. Theconversationproject.org and agingwithdignity.org are excellent resources.

## 2015-12-08 NOTE — Telephone Encounter (Signed)
Called and spoke to Patient and relayed that her echocardiogram is scheduled for 12/25/2015 arrive at 3:30 .[phone] No PA required . Patient understood all details.

## 2015-12-08 NOTE — Progress Notes (Signed)
[facility]  PATIENT: Megan Graham DOB: 07/01/1940  REFERRING CLINICIAN: H Stoneking HISTORY FROM: patient  REASON FOR VISIT: new consult (urgent)   HISTORICAL  CHIEF COMPLAINT:  Chief Complaint  Patient presents with  . Transient cerbral ischemia    rm 7, New pt, " 10/17 heavy feeling in L foot; 10/21 reoccured again L foot"    HISTORY OF PRESENT ILLNESS:   75 year old right-handed female here for evaluation of TIA. Patient has had 2 episodes of transient transient left lower extremity weakness on 11/24/15 and 11/28/15. Patient had no pain or numbness with these attacks. Symptoms were sudden onset lasting approximate 5 minutes each.  Patient does have history of left lower Graham pain with "sciatic" nerve problem with pain radiating from the left buttock into the left calf.  Patient reported these symptoms to PCP who recognized these as possible transient ischemic attacks. Therefore patient referred to me for urgent consultation.  No other recent triggering or aggravating factors. No history of stroke or TIA. Patient has hypercholesteremia, breast cancer, normal pressure glaucoma.   REVIEW OF SYSTEMS: Full 14 system review of systems performed and negative with exception of: Rash itching. Otherwise negative.  ALLERGIES: Allergies  Allergen Reactions  . Tape Dermatitis    Paper tape ok  . Codeine Nausea And Vomiting  . Meloxicam Rash  . Nsaids Rash  . Sulfa Antibiotics Rash    HOME MEDICATIONS: Outpatient Medications Prior to Visit  Medication Sig Dispense Refill  . atorvastatin (LIPITOR) 10 MG tablet Take 10 mg by mouth daily.     . Calcium Carbonate-Vitamin D (CALCIUM PLUS VITAMIN D PO) Take by mouth daily.    . Cholecalciferol (VITAMIN D) 2000 UNITS CAPS Take 2,000 Units by mouth daily.    . Coenzyme Q10 (CO Q 10 PO) Take 1 capsule by mouth daily. 100mg     . Multiple Vitamins-Minerals (CENTRUM SILVER PO) Take 1 tablet by mouth daily.     .  timolol (TIMOPTIC) 0.5 % ophthalmic solution Place 1 drop into both eyes 2 (two) times daily.     . TRAVATAN Z 0.004 % SOLN ophthalmic solution     . latanoprost (XALATAN) 0.005 % ophthalmic solution Place 1 drop into both eyes at bedtime.     No facility-administered medications prior to visit.     PAST MEDICAL HISTORY: Past Medical History:  Diagnosis Date  . Breast cancer ([place]) 2003   right-lumpectomy  . Cellulitis of left arm    right arm not left   . Femoral hernia 1992  . GERD (gastroesophageal reflux disease)   . Glaucoma    low pressure  . Lymphedema of arm 04/2006   Right arm, Quit arimidex 07/2007  . PONV (postoperative nausea and vomiting)    primarily nausea     PAST SURGICAL HISTORY: Past Surgical History:  Procedure Laterality Date  . AXILLARY LYMPH NODE DISSECTION Right 10/18/2001  . BREAST LUMPECTOMY  10/2001   lumpectomy, sentinel node bx, arimidex  . BUNIONECTOMY  1980  . DILATATION & CURETTAGE/HYSTEROSCOPY WITH MYOSURE N/A 10/27/2014   Procedure: DILATATION & CURETTAGE/HYSTEROSCOPY WITH RESECTOCOPE AND MYOSURE ;  Surgeon: Megan Salon, MD;  Location: [facility];  Service: Gynecology;  Laterality: N/A;  . FEMORAL HERNIA REPAIR  1992    FAMILY HISTORY: Family History  Problem Relation Age of Onset  . Hypertension Mother   . Osteoporosis Mother   . Heart failure Mother   . Asthma Brother   . Emphysema Brother   .  Cancer - Prostate Brother   . Diabetes Mellitus II Sister     SOCIAL HISTORY:  Social History   Social History  . Marital status: Married    Spouse name: Natale Milch  . Number of children: 0  . Years of education: 8   Occupational History  .      retired Pharmacist, hospital   Social History Main Topics  . Smoking status: Never Smoker  . Smokeless tobacco: Never Used  . Alcohol use No  . Drug use: No  . Sexual activity: No   Other Topics Concern  . Not on file   Social History Narrative   Lives with husband   Caffeine - limited     PHYSICAL  EXAM  GENERAL EXAM/CONSTITUTIONAL: Vitals:  Vitals:   12/08/15 0905  BP: 128/79  Pulse: 73  Weight: 150 lb (68 kg)  Height: 5' 3.5" (1.613 m)     Body mass index is 26.15 kg/m.  No exam data present  Patient is in no distress; well developed, nourished and groomed; neck is supple  CARDIOVASCULAR:  Examination of carotid arteries is normal; no carotid bruits  Regular rate and rhythm, no murmurs  Examination of peripheral vascular system by observation and palpation is normal  EYES:  Ophthalmoscopic exam of optic discs and posterior segments is normal; no papilledema or hemorrhages  MUSCULOSKELETAL:  Gait, strength, tone, movements noted in Neurologic exam below  NEUROLOGIC: MENTAL STATUS:  No flowsheet data found.  awake, alert, oriented to person, place and time  recent and remote memory intact  normal attention and concentration  language fluent, comprehension intact, naming intact,   fund of knowledge appropriate  CRANIAL NERVE:   2nd - no papilledema on fundoscopic exam  2nd, 3rd, 4th, 6th - pupils equal and reactive to light, visual fields full to confrontation, extraocular muscles intact, no nystagmus  5th - facial sensation symmetric  7th - facial strength symmetric  8th - hearing intact  9th - palate elevates symmetrically, uvula midline  11th - shoulder shrug symmetric  12th - tongue protrusion midline  MOTOR:   normal bulk and tone, full strength in the BUE, BLE  SENSORY:   normal and symmetric to light touch, pinprick, temperature, vibration  EXCEPT HYPERSENS PP IN LEFT FOOT  EXCEPT DECR TEMP IN LEFT FOOT  COORDINATION:   finger-nose-finger, fine finger movements normal  REFLEXES:   deep tendon reflexes present and symmetric  EXCEPT SLIGHTLY DECR LEFT ANKLE JERK REFLEX  GAIT/STATION:   narrow based gait; able to walk on toes and heels; romberg is negative    DIAGNOSTIC DATA (LABS, IMAGING, TESTING) - I reviewed  patient records, labs, notes, testing and imaging myself where available.  Lab Results  Component Value Date   WBC 6.9 10/23/2014   HGB 13.3 10/23/2014   HCT 39.6 10/23/2014   MCV 93.6 10/23/2014   PLT 207 10/23/2014      Component Value Date/Time   NA 141 10/02/2015 1446   NA 142 01/30/2012 0842   K 4.8 10/02/2015 1446   K 4.3 01/30/2012 0842   CL 104 10/02/2015 1446   CL 104 01/30/2012 0842   CO2 25 10/02/2015 1446   CO2 29 01/30/2012 0842   GLUCOSE 93 10/02/2015 1446   GLUCOSE 93 01/30/2012 0842   BUN 16 10/02/2015 1446   BUN 13.0 01/30/2012 0842   CREATININE 0.90 10/02/2015 1446   CREATININE 0.9 01/30/2012 0842   CALCIUM 9.1 10/02/2015 1446   CALCIUM 8.8 01/30/2012 0842  PROT 5.8 (L) 01/30/2012 0842   ALBUMIN 3.5 01/30/2012 0842   AST 14 01/30/2012 0842   ALT 16 01/30/2012 0842   ALKPHOS 58 01/30/2012 0842   BILITOT 0.53 01/30/2012 0842   No results found for: CHOL, HDL, LDLCALC, LDLDIRECT, TRIG, CHOLHDL No results found for: HGBA1C No results found for: VITAMINB12 No results found for: TSH   10/23/14 EKG [I reviewed images myself and agree with interpretation. -VRP]  - normal sinus rhythm      ASSESSMENT AND PLAN  75 y.o. year old female here with new onset transient left lower extremity weakness lasting 5 minutes each on 11/24/15 and 11/28/15. Could represent right brain transient ischemic attacks. Also with history of left lumbar radiculopathy. Neurologic exam notable for slightly decreased sensation and reflexes in left lower extremity.   Ddx: right brain TIA vs left lumbar radiculopathy  1. Transient cerebral ischemia, unspecified type   2. Transient left leg weakness   3. Weakness of left foot      PLAN: - TIA workup - continue aspirin and atorvastatin - stroke / TIA education reviewed - In future may consider MRI lumbar spine for evaluation of left lumbar radiculopathy  Orders Placed This Encounter  Procedures  . MR BRAIN WO CONTRAST  .  MR MRA HEAD WO CONTRAST  . US Carotid Bilateral  . ECHOCARDIOGRAM COMPLETE   Return in about 6 weeks (around 01/19/2016).  I reviewed images, labs, notes, records myself. I summarized findings and reviewed with patient, for this high risk condition (TIA) requiring high complexity decision making.     Penni Bombard, MD [id number], [id number] AM Certified in Neurology, Neurophysiology and Neuroimaging  [facility] [address] [place], [place] [address] [phone]

## 2015-12-14 ENCOUNTER — Telehealth: Payer: Self-pay | Admitting: *Deleted

## 2015-12-14 NOTE — Telephone Encounter (Signed)
Spoke with patient and informed her, per Dr Leta Baptist her MRI brain and MRA head results are unremarkable. Reminded her of upcoming apts for carotid UA, ECHO. Advised she will get a call with those results when ready. She inquired if she would be notified if something concerning showed up in the two upcoming tests; advised her that she would and her FU could be moved sooner if needed. Otherwise Dr Leta Baptist will discuss in detail in her fu in Jan. She verbalized understanding, appreciation.

## 2015-12-14 NOTE — Telephone Encounter (Signed)
Pt returned call

## 2015-12-14 NOTE — Telephone Encounter (Signed)
LVM re: results. Left name, number for call back.

## 2015-12-17 ENCOUNTER — Ambulatory Visit (INDEPENDENT_AMBULATORY_CARE_PROVIDER_SITE_OTHER): Payer: Medicare Other

## 2015-12-17 DIAGNOSIS — G459 Transient cerebral ischemic attack, unspecified: Secondary | ICD-10-CM | POA: Diagnosis not present

## 2015-12-17 DIAGNOSIS — R29898 Other symptoms and signs involving the musculoskeletal system: Secondary | ICD-10-CM

## 2015-12-22 ENCOUNTER — Encounter: Payer: Self-pay | Admitting: Obstetrics & Gynecology

## 2015-12-25 ENCOUNTER — Ambulatory Visit (HOSPITAL_COMMUNITY): Payer: Medicare Other | Attending: Internal Medicine

## 2015-12-25 ENCOUNTER — Other Ambulatory Visit: Payer: Self-pay

## 2015-12-25 DIAGNOSIS — R29898 Other symptoms and signs involving the musculoskeletal system: Secondary | ICD-10-CM | POA: Diagnosis not present

## 2015-12-25 DIAGNOSIS — Z853 Personal history of malignant neoplasm of breast: Secondary | ICD-10-CM | POA: Diagnosis not present

## 2015-12-25 DIAGNOSIS — G459 Transient cerebral ischemic attack, unspecified: Secondary | ICD-10-CM | POA: Insufficient documentation

## 2015-12-25 DIAGNOSIS — M2142 Flat foot [pes planus] (acquired), left foot: Secondary | ICD-10-CM | POA: Diagnosis not present

## 2015-12-28 ENCOUNTER — Telehealth: Payer: Self-pay | Admitting: *Deleted

## 2015-12-28 NOTE — Telephone Encounter (Signed)
Per Dr Leonie Man, informed patient her carotid US results are normal. She verbalized understanding, appreciation.

## 2015-12-28 NOTE — Telephone Encounter (Signed)
Per Dr Leta Baptist, spoke with patient and informed her that her echocardiogram was an unremarkable study. There were no major findings.  She inquired about her carotid US results, test done on 12/17/15; advised the results were not available. However this RN stated Dr Leta Baptist would have been notified right away by the technician if there were any concerning findings during her test. Advised she would get a call when results are available. Confirmed her FU in Jan at which time she stated she would like all results of tests reviewed. She verbalized understanding, appreciation of call.

## 2016-02-10 ENCOUNTER — Ambulatory Visit (INDEPENDENT_AMBULATORY_CARE_PROVIDER_SITE_OTHER): Payer: Medicare Other | Admitting: Diagnostic Neuroimaging

## 2016-02-10 ENCOUNTER — Encounter: Payer: Self-pay | Admitting: Diagnostic Neuroimaging

## 2016-02-10 VITALS — BP 136/76 | HR 87 | Wt 153.0 lb

## 2016-02-10 DIAGNOSIS — M2142 Flat foot [pes planus] (acquired), left foot: Secondary | ICD-10-CM

## 2016-02-10 DIAGNOSIS — R29898 Other symptoms and signs involving the musculoskeletal system: Secondary | ICD-10-CM

## 2016-02-10 DIAGNOSIS — G459 Transient cerebral ischemic attack, unspecified: Secondary | ICD-10-CM | POA: Diagnosis not present

## 2016-02-10 NOTE — Progress Notes (Signed)
[facility]  PATIENT: Megan Graham DOB: 1940-12-11  REFERRING CLINICIAN: H Stoneking HISTORY FROM: patient  REASON FOR VISIT: follow up    HISTORICAL  CHIEF COMPLAINT:  Chief Complaint  Patient presents with  . Transient cerebral ischemia    rm 6, "doing well; go over my tests"  . Follow-up    6 week    HISTORY OF PRESENT ILLNESS:   UPDATE 02/10/16: Since last visit, doing well. No more attacks. Left leg is normal. Testing results reviewed.  PRIOR HPI (12/08/15): 76 year old right-handed female here for evaluation of TIA. Patient has had 2 episodes of transient transient left lower extremity weakness on 11/24/15 and 11/28/15. Patient had no pain or numbness with these attacks. Symptoms were sudden onset lasting approximate 5 minutes each. Patient does have history of left lower back pain with "sciatic" nerve problem with pain radiating from the left buttock into the left calf. Patient reported these symptoms to PCP who recognized these as possible transient ischemic attacks. Therefore patient referred to me for urgent consultation. No other recent triggering or aggravating factors. No history of stroke or TIA. Patient has hypercholesteremia, breast cancer, normal pressure glaucoma.   REVIEW OF SYSTEMS: Full 14 system review of systems performed and negative with exception of: eye itching. Otherwise negative.  ALLERGIES: Allergies  Allergen Reactions  . Tape Dermatitis    Paper tape ok  . Codeine Nausea And Vomiting  . Meloxicam Rash  . Nsaids Rash  . Sulfa Antibiotics Rash    HOME MEDICATIONS: Outpatient Medications Prior to Visit  Medication Sig Dispense Refill  . aspirin 81 MG chewable tablet Chew by mouth daily.    Marland Kitchen atorvastatin (LIPITOR) 10 MG tablet Take 10 mg by mouth daily.     . Calcium Carbonate-Vitamin D (CALCIUM PLUS VITAMIN D PO) Take by mouth daily.    . Cholecalciferol (VITAMIN D) 2000 UNITS CAPS Take 2,000 Units by mouth daily.    .  Coenzyme Q10 (CO Q 10 PO) Take 1 capsule by mouth daily. 100mg     . Multiple Vitamins-Minerals (CENTRUM SILVER PO) Take 1 tablet by mouth daily.     . timolol (TIMOPTIC) 0.5 % ophthalmic solution Place 1 drop into both eyes 2 (two) times daily.     . TRAVATAN Z 0.004 % SOLN ophthalmic solution      No facility-administered medications prior to visit.     PAST MEDICAL HISTORY: Past Medical History:  Diagnosis Date  . Breast cancer ([place]) 2003   right-lumpectomy  . Cellulitis of left arm    right arm not left   . Femoral hernia 1992  . GERD (gastroesophageal reflux disease)   . Glaucoma    low pressure  . Lymphedema of arm 04/2006   Right arm, Quit arimidex 07/2007  . PONV (postoperative nausea and vomiting)    primarily nausea     PAST SURGICAL HISTORY: Past Surgical History:  Procedure Laterality Date  . AXILLARY LYMPH NODE DISSECTION Right 10/18/2001  . BREAST LUMPECTOMY  10/2001   lumpectomy, sentinel node bx, arimidex  . BUNIONECTOMY  1980  . DILATATION & CURETTAGE/HYSTEROSCOPY WITH MYOSURE N/A 10/27/2014   Procedure: DILATATION & CURETTAGE/HYSTEROSCOPY WITH RESECTOCOPE AND MYOSURE ;  Surgeon: Megan Salon, MD;  Location: [facility];  Service: Gynecology;  Laterality: N/A;  . FEMORAL HERNIA REPAIR  1992    FAMILY HISTORY: Family History  Problem Relation Age of Onset  . Hypertension Mother   . Osteoporosis Mother   .  Heart failure Mother   . Asthma Brother   . Emphysema Brother   . Cancer - Prostate Brother   . Diabetes Mellitus II Sister     SOCIAL HISTORY:  Social History   Social History  . Marital status: Married    Spouse name: Natale Milch  . Number of children: 0  . Years of education: 8   Occupational History  .      retired Pharmacist, hospital   Social History Main Topics  . Smoking status: Never Smoker  . Smokeless tobacco: Never Used  . Alcohol use No  . Drug use: No  . Sexual activity: No   Other Topics Concern  . Not on file   Social History Narrative    Lives with husband   Caffeine - limited     PHYSICAL EXAM  GENERAL EXAM/CONSTITUTIONAL: Vitals:  Vitals:   02/10/16 1415  BP: 136/76  Pulse: 87  Weight: 153 lb (69.4 kg)   Body mass index is 26.68 kg/m. No exam data present  Patient is in no distress; well developed, nourished and groomed; neck is supple  CARDIOVASCULAR:  Examination of carotid arteries is normal; no carotid bruits  Regular rate and rhythm, no murmurs  Examination of peripheral vascular system by observation and palpation is normal  EYES:  Ophthalmoscopic exam of optic discs and posterior segments is normal; no papilledema or hemorrhages  MUSCULOSKELETAL:  Gait, strength, tone, movements noted in Neurologic exam below  NEUROLOGIC: MENTAL STATUS:  No flowsheet data found.  awake, alert, oriented to person, place and time  recent and remote memory intact  normal attention and concentration  language fluent, comprehension intact, naming intact,   fund of knowledge appropriate  CRANIAL NERVE:   2nd - no papilledema on fundoscopic exam  2nd, 3rd, 4th, 6th - pupils equal and reactive to light, visual fields full to confrontation, extraocular muscles intact, no nystagmus  5th - facial sensation symmetric  7th - facial strength symmetric  8th - hearing intact  9th - palate elevates symmetrically, uvula midline  11th - shoulder shrug symmetric  12th - tongue protrusion midline  MOTOR:   normal bulk and tone, full strength in the BUE, BLE  SENSORY:   normal and symmetric to light touch, temperature, vibration  EXCEPT HYPERSENS PP IN LEFT FOOT  EXCEPT DECR TEMP IN LEFT FOOT  COORDINATION:   finger-nose-finger, fine finger movements normal  REFLEXES:   deep tendon reflexes present and symmetric  EXCEPT SLIGHTLY DECR LEFT ANKLE JERK REFLEX  GAIT/STATION:   narrow based gait; able to walk heels; romberg is negative    DIAGNOSTIC DATA (LABS, IMAGING, TESTING) - I  reviewed patient records, labs, notes, testing and imaging myself where available.  Lab Results  Component Value Date   WBC 6.9 10/23/2014   HGB 13.3 10/23/2014   HCT 39.6 10/23/2014   MCV 93.6 10/23/2014   PLT 207 10/23/2014      Component Value Date/Time   NA 141 10/02/2015 1446   NA 142 01/30/2012 0842   K 4.8 10/02/2015 1446   K 4.3 01/30/2012 0842   CL 104 10/02/2015 1446   CL 104 01/30/2012 0842   CO2 25 10/02/2015 1446   CO2 29 01/30/2012 0842   GLUCOSE 93 10/02/2015 1446   GLUCOSE 93 01/30/2012 0842   BUN 16 10/02/2015 1446   BUN 13.0 01/30/2012 0842   CREATININE 0.90 10/02/2015 1446   CREATININE 0.9 01/30/2012 0842   CALCIUM 9.1 10/02/2015 1446   CALCIUM 8.8 01/30/2012  0842   PROT 5.8 (L) 01/30/2012 0842   ALBUMIN 3.5 01/30/2012 0842   AST 14 01/30/2012 0842   ALT 16 01/30/2012 0842   ALKPHOS 58 01/30/2012 0842   BILITOT 0.53 01/30/2012 0842   No results found for: CHOL, HDL, LDLCALC, LDLDIRECT, TRIG, CHOLHDL No results found for: HGBA1C No results found for: VITAMINB12 No results found for: TSH   10/23/14 EKG [I reviewed images myself and agree with interpretation. -VRP]  - normal sinus rhythm   12/08/15 Equivocal MRI brain (without) demonstrating: 1. Minimal punctate periventricular and subcortical foci of non-specific gliosis.  2. No acute findings.  12/08/15 MRA head  - normal  12/25/15 TTE  - Left ventricle: The cavity size was normal. Wall thickness was   normal. Systolic function was normal. The estimated ejection   fraction was in the range of 60% to 65%. Doppler parameters are   consistent with abnormal left ventricular relaxation (grade 1   diastolic dysfunction).  12/17/15 carotid u/s  - no ICA stenosis     ASSESSMENT AND PLAN  76 y.o. year old female here with new onset transient left lower extremity weakness lasting 5 minutes each on 11/24/15 and 11/28/15. Could represent right brain transient ischemic attacks. Also with history of  left lumbar radiculopathy. Neurologic exam notable for slightly decreased sensation and reflexes in left lower extremity.   Ddx: right brain TIA vs left lumbar radiculopathy  1. Transient cerebral ischemia, unspecified type   2. Weakness of left foot   3. Transient left leg weakness      PLAN: I spent 15 minutes of face to face time with patient. Greater than 50% of time was spent in counseling and coordination of care with patient. In summary we discussed: - continue aspirin and atorvastatin - stroke / TIA education reviewed  Return if symptoms worsen or fail to improve, for return to PCP.     Penni Bombard, MD [id number], [id number] PM Certified in Neurology, Neurophysiology and Neuroimaging  [facility] [address] [place], [place] [address] [phone]

## 2016-04-06 ENCOUNTER — Encounter: Payer: Self-pay | Admitting: Obstetrics and Gynecology

## 2016-04-06 ENCOUNTER — Ambulatory Visit (INDEPENDENT_AMBULATORY_CARE_PROVIDER_SITE_OTHER): Payer: Medicare Other | Admitting: Obstetrics and Gynecology

## 2016-04-06 VITALS — BP 118/68 | HR 80 | Resp 16 | Wt 149.0 lb

## 2016-04-06 DIAGNOSIS — N390 Urinary tract infection, site not specified: Secondary | ICD-10-CM

## 2016-04-06 DIAGNOSIS — R309 Painful micturition, unspecified: Secondary | ICD-10-CM | POA: Diagnosis not present

## 2016-04-06 DIAGNOSIS — R319 Hematuria, unspecified: Secondary | ICD-10-CM | POA: Diagnosis not present

## 2016-04-06 LAB — POCT URINALYSIS DIPSTICK
Bilirubin, UA: NEGATIVE
GLUCOSE UA: NEGATIVE
Ketones, UA: NEGATIVE
NITRITE UA: NEGATIVE
PROTEIN UA: NEGATIVE
UROBILINOGEN UA: NEGATIVE
pH, UA: 5

## 2016-04-06 MED ORDER — NITROFURANTOIN MONOHYD MACRO 100 MG PO CAPS: 100.0000 mg | ORAL_CAPSULE | Freq: Two times a day (BID) | ORAL | 0 refills | Status: DC

## 2016-04-06 NOTE — Progress Notes (Signed)
GYNECOLOGY  VISIT   HPI: 76 y.o.   Married  Caucasian  female   [place] with Patient's last menstrual period was 02/07/1989.   here for  uti symptoms x3 days; pain with urination.  Urine Dip: 2+WBCs, 2+RBCs  States hx of bladder infections for 40 years.  Retired Education officer, museum.   Has pain at the end of urethra with voiding. No hematuria.  Has urgency usually.   No fever, back pain, nausea or vomiting.   Hydrating well.   2 caffeines per day.  States 3 - 4 infections per year.  E Coli UTI in 09/2015. E Coli UTI in December 2017 per verbal report from patient - Dr. Carlyle Lipa office.  Neg UC 08/2014. Neg UC 03/2013.  Using wipes to clean.  This is not a post coital UTI.   Hx breast cancer.  Estrogen sensitive.   CT scan 10/2015 for possible left abdominal wall mass - kidneys with subcentimeter cysts.  No abdominal wall mass noted.  GYNECOLOGIC HISTORY: Patient's last menstrual period was 02/07/1989. Contraception:  Postmenopausal Menopausal hormone therapy:  none Last mammogram:  12/09/15 BIRADS 2 benign Last pap smear:   10/06/2014 WNL        OB History    Gravida Para Term Preterm AB Living   0 0 0 0 0 0   SAB TAB Ectopic Multiple Live Births   0 0 0 0           Patient Active Problem List   Diagnosis Date Noted  . Glaucoma 10/06/2014  . History of breast cancer in female 11/26/2012    Past Medical History:  Diagnosis Date  . Breast cancer ([place]) 2003   right-lumpectomy  . Cellulitis of left arm    right arm not left   . Femoral hernia 1992  . GERD (gastroesophageal reflux disease)   . Glaucoma    low pressure  . Lymphedema of arm 04/2006   Right arm, Quit arimidex 07/2007  . PONV (postoperative nausea and vomiting)    primarily nausea     Past Surgical History:  Procedure Laterality Date  . AXILLARY LYMPH NODE DISSECTION Right 10/18/2001  . BREAST LUMPECTOMY  10/2001   lumpectomy, sentinel node bx, arimidex  . BUNIONECTOMY  1980  . DILATATION &  CURETTAGE/HYSTEROSCOPY WITH MYOSURE N/A 10/27/2014   Procedure: DILATATION & CURETTAGE/HYSTEROSCOPY WITH RESECTOCOPE AND MYOSURE ;  Surgeon: Megan Salon, MD;  Location: [facility];  Service: Gynecology;  Laterality: N/A;  . FEMORAL HERNIA REPAIR  1992    Current Outpatient Prescriptions  Medication Sig Dispense Refill  . aspirin 81 MG chewable tablet Chew by mouth daily.    Marland Kitchen atorvastatin (LIPITOR) 10 MG tablet Take 10 mg by mouth daily.     . Calcium Carbonate-Vitamin D (CALCIUM PLUS VITAMIN D PO) Take by mouth daily.    . Cholecalciferol (VITAMIN D) 2000 UNITS CAPS Take 2,000 Units by mouth daily.    . Coenzyme Q10 (CO Q 10 PO) Take 1 capsule by mouth daily. 100mg     . Multiple Vitamins-Minerals (CENTRUM SILVER PO) Take 1 tablet by mouth daily.     . timolol (TIMOPTIC) 0.5 % ophthalmic solution Place 1 drop into both eyes 2 (two) times daily.     . TRAVATAN Z 0.004 % SOLN ophthalmic solution      No current facility-administered medications for this visit.      ALLERGIES: Tape; Codeine; Meloxicam; Nsaids; and Sulfa antibiotics  Family History  Problem Relation Age of Onset  .  Hypertension Mother   . Osteoporosis Mother   . Heart failure Mother   . Asthma Brother   . Emphysema Brother   . Cancer - Prostate Brother   . Diabetes Mellitus II Sister     Social History   Social History  . Marital status: Married    Spouse name: Natale Milch  . Number of children: 0  . Years of education: 8   Occupational History  .      retired Pharmacist, hospital   Social History Main Topics  . Smoking status: Never Smoker  . Smokeless tobacco: Never Used  . Alcohol use No  . Drug use: No  . Sexual activity: No   Other Topics Concern  . Not on file   Social History Narrative   Lives with husband   Caffeine - limited    ROS:  Pertinent items are noted in HPI.  PHYSICAL EXAMINATION:    BP 118/68 (BP Location: Right Arm, Patient Position: Sitting, Cuff Size: Normal)   Pulse 80   Resp 16   Wt 149  lb (67.6 kg)   LMP 02/07/1989   BMI 25.98 kg/m     General appearance: alert, cooperative and appears stated age   Abdomen: soft, non-tender, no masses,  no organomegaly Back:  No CVA tenderness.  Pelvic: External genitalia:  no lesions              Urethra:  normal appearing urethra with no masses, tenderness or lesions              Bartholins and Skenes: normal                 Vagina: normal appearing vagina with normal color and discharge, no lesions              Cervix: no lesions                Bimanual Exam:  Uterus:  normal size, contour, position, consistency, mobility, non-tender              Adnexa: no mass, fullness, tenderness        Chaperone was present for exam.  ASSESSMENT  Dysuria.  Abnormal urine.  Hx recurrent UTIs. Hx estrogen sensitive breast cancer.   PLAN  Urine micro and culture.  Macrobid 100 mg po bid for 7 days.  If this culture confirms infection, would consider urology consultation.  We discussed the possible need for cystoscopy.  She may benefit from a 3 month course of abx.  Not a good candidate for local vaginal estrogen therapy.    An After Visit Summary was printed and given to the patient.  ___25___ minutes face to face time of which over 50% was spent in counseling.

## 2016-04-07 LAB — URINALYSIS, MICROSCOPIC ONLY
Bacteria, UA: NONE SEEN [HPF]
Casts: NONE SEEN [LPF]
Crystals: NONE SEEN [HPF]
RBC / HPF: NONE SEEN RBC/HPF (ref <=2)
Yeast: NONE SEEN [HPF]

## 2016-04-09 LAB — URINE CULTURE

## 2016-04-11 ENCOUNTER — Telehealth: Payer: Self-pay

## 2016-04-11 DIAGNOSIS — Z87440 Personal history of urinary (tract) infections: Secondary | ICD-10-CM

## 2016-04-11 NOTE — Telephone Encounter (Signed)
-----   Message from Nunzio Cobbs, MD sent at 04/09/2016  8:35 PM EST ----- Please report urine culture showing Klebsiella.  The Macrobid is an appropriate antibiotic.  I would like for her to have a consultation with Dr. Matilde Sprang at [facility] urology for recurrent UTIs. Please let me know if she is not feeling better.

## 2016-04-11 NOTE — Telephone Encounter (Signed)
Spoke with patient. Advised of message as seen below from [place]. Patient is agreeable and verbalizes understanding. Patient would like to proceed with referral to Dr.MacDiarmid. Referral placed. Aware she will be contacted by our referrals coordinator or Dr.MacDiarmid's office regarding scheduling. Patient is agreeable.  Cc: Theresia Lo for referral  Routing to provider for final review. Patient agreeable to disposition. Will close encounter.

## 2016-04-22 NOTE — Progress Notes (Addendum)
76 y.o. G0P0000 Married Caucasian F here for annual exam.  Seeing urology now due to recurrent UTIs.  Cultures have been positive for both Kelbsiella and E coli.  Was treated four times in the past year for a UTI.  Has already seen the urologist, Dr. Matilde Sprang.  Having a cystoscopy in April.  States she is "dreading" this.    Had episode of heaviness of left leg and left foot that occurred twice in one week.  She went to an urgent care and followed up with Dr. Felipa Eth.  She was referred to Dr. Tish Frederickson.  Had echo, carotid dopplers, and MRI.  Has been released now.  Is taking AsA 81mg , newly.  She is also on Lipitor 10mg  daily as well.  Will have follow up with Dr. Felipa Eth.  If occurs again, lumbar radiculopathy w/u will be recommended.    Denies vaginal bleeding.    Patient's last menstrual period was 02/07/1989.          Sexually active: No.  The current method of family planning is post menopausal status.    Exercising: Yes.    walking, strength and balance Smoker:  no  Health Maintenance: Pap:  10/06/14 negative  History of abnormal Pap:  no MMG:  12/09/15 BIRADS 2 benign  Colonoscopy:  11/16/07 normal- [facility]  BMD:   12/08/14 osteopenia TDaP:  02/08/12  Pneumonia vaccine(s):  08/21/13, 03/21/16  Zostavax:   01/07/10 Hep C testing: not indicated  Screening Labs: PCP takes care of labs   reports that she has never smoked. She has never used smokeless tobacco. She reports that she does not drink alcohol or use drugs.  Past Medical History:  Diagnosis Date  . Breast cancer ([place]) 2003   right-lumpectomy  . Cellulitis of left arm    right arm not left   . Femoral hernia 1992  . GERD (gastroesophageal reflux disease)   . Glaucoma    low pressure  . Lymphedema of arm 04/2006   Right arm, Quit arimidex 07/2007  . PONV (postoperative nausea and vomiting)    primarily nausea     Past Surgical History:  Procedure Laterality Date  . AXILLARY LYMPH NODE DISSECTION Right 10/18/2001  .  BREAST LUMPECTOMY  10/2001   lumpectomy, sentinel node bx, arimidex  . BUNIONECTOMY  1980  . DILATATION & CURETTAGE/HYSTEROSCOPY WITH MYOSURE N/A 10/27/2014   Procedure: DILATATION & CURETTAGE/HYSTEROSCOPY WITH RESECTOCOPE AND MYOSURE ;  Surgeon: Megan Salon, MD;  Location: [facility];  Service: Gynecology;  Laterality: N/A;  . FEMORAL HERNIA REPAIR  1992    Current Outpatient Prescriptions  Medication Sig Dispense Refill  . aspirin 81 MG chewable tablet Chew by mouth daily.    Marland Kitchen atorvastatin (LIPITOR) 10 MG tablet Take 10 mg by mouth daily.     . Cholecalciferol (VITAMIN D) 2000 UNITS CAPS Take 2,000 Units by mouth daily.    . Coenzyme Q10 (CO Q 10 PO) Take 1 capsule by mouth daily. 100mg     . Multiple Vitamins-Minerals (CENTRUM SILVER PO) Take 1 tablet by mouth daily.     . timolol (TIMOPTIC) 0.5 % ophthalmic solution Place 1 drop into both eyes 2 (two) times daily.     . TRAVATAN Z 0.004 % SOLN ophthalmic solution      No current facility-administered medications for this visit.     Family History  Problem Relation Age of Onset  . Hypertension Mother   . Osteoporosis Mother   . Heart failure Mother   .  Asthma Brother   . Emphysema Brother   . Cancer - Prostate Brother   . Diabetes Mellitus II Sister     ROS:  Pertinent items are noted in HPI.  Otherwise, a comprehensive ROS was negative.  Exam:   BP 126/70 (BP Location: Right Arm, Patient Position: Sitting, Cuff Size: Normal)   Pulse 80   Resp 16   Ht 5' 3.5" (1.613 m)   Wt 150 lb (68 kg)   LMP 02/07/1989   BMI 26.15 kg/m   Weight change: +1# Height: 5' 3.5" (161.3 cm)  Ht Readings from Last 3 Encounters:  04/25/16 5' 3.5" (1.613 m)  12/08/15 5' 3.5" (1.613 m)  10/02/15 5' 3.5" (1.613 m)    General appearance: alert, cooperative and appears stated age Head: Normocephalic, without obvious abnormality, atraumatic Neck: no adenopathy, supple, symmetrical, trachea midline and thyroid normal to inspection and  palpation Lungs: clear to auscultation bilaterally Breasts: normal appearance, no masses or tenderness, well healed scars right breast with radiation changes--stable Heart: regular rate and rhythm Abdomen: soft, non-tender; bowel sounds normal; no masses,  no organomegaly Extremities: extremities normal, atraumatic, no cyanosis or edema Skin: Skin color, texture, turgor normal. No rashes or lesions Lymph nodes: Cervical, supraclavicular, and axillary nodes normal. No abnormal inguinal nodes palpated Neurologic: Grossly normal   Pelvic: External genitalia:  no lesions              Urethra:  normal appearing urethra with no masses, tenderness or lesions              Bartholins and Skenes: normal                 Vagina: normal appearing vagina with normal color and discharge, no lesions              Cervix: no lesions              Pap taken: No. Bimanual Exam:  Uterus:  normal size, contour, position, consistency, mobility, non-tender              Adnexa: normal adnexa and no mass, fullness, tenderness               Rectovaginal: Confirms               Anus:  normal sphincter tone, no lesions  Chaperone was present for exam.  A:  Well Woman with normal exam PMP, no HRT H/o breast cancer 9/03, s/p lumpectomy and SNB, s/p chemo and radiation then five years of armidex. Subsequent right arm Lymphedema Elevated lipids H/O hysteroscopy with polyp reesection 9/16 Possible TIA 11/17 with negative w/u and evaluation with neurology  P:   Mammogram guidelines reviewed.  MMG UTD. pap smear neg 8/16.  No pap smear obtained today. Lab work UTD with Dr. Felipa Eth Undergoing urology evaluation AEX 1 year or follow up PRN return annually or prn

## 2016-04-25 ENCOUNTER — Ambulatory Visit (INDEPENDENT_AMBULATORY_CARE_PROVIDER_SITE_OTHER): Payer: Medicare Other | Admitting: Obstetrics & Gynecology

## 2016-04-25 ENCOUNTER — Encounter: Payer: Self-pay | Admitting: Obstetrics & Gynecology

## 2016-04-25 VITALS — BP 126/70 | HR 80 | Resp 16 | Ht 63.5 in | Wt 150.0 lb

## 2016-04-25 DIAGNOSIS — Z01419 Encounter for gynecological examination (general) (routine) without abnormal findings: Secondary | ICD-10-CM

## 2016-06-14 ENCOUNTER — Ambulatory Visit: Payer: Medicare Other | Admitting: Obstetrics & Gynecology

## 2016-06-17 ENCOUNTER — Ambulatory Visit: Payer: Self-pay | Admitting: Obstetrics & Gynecology

## 2017-01-05 IMAGING — MR MR KNEE*R* W/O CM
4 of 5 series · 32 of 40 positions shown · non-contrast
Comparison: None.

CLINICAL DATA: Chronic medial right knee pain. No known injury.
Initial encounter.

EXAM:
MRI OF THE RIGHT KNEE WITHOUT CONTRAST
TECHNIQUE: Multiplanar, multisequence MR imaging of the knee was performed. No
intravenous contrast was administered.

[Series 6: PD fat-sat · axial · right · 3.0mm · 0.39mm/px · z∈[+8,+123]mm · 9 of 36 slices shown (1 of 3)]
[im 1/36]
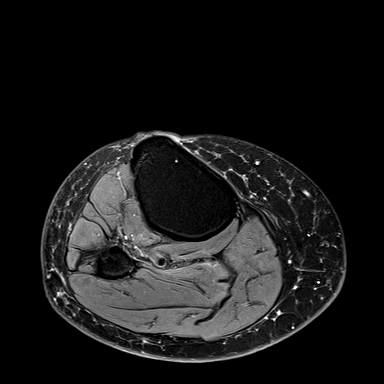
[im 5/36]
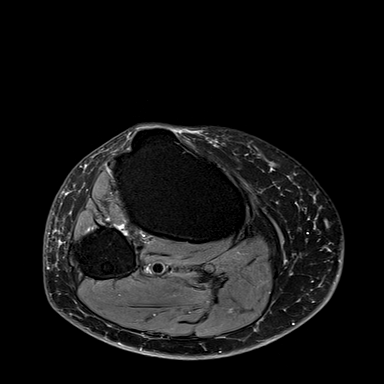
[im 9/36]
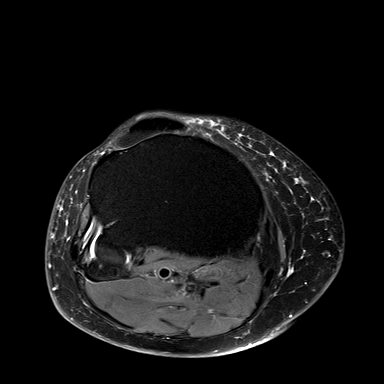
[im 14/36]
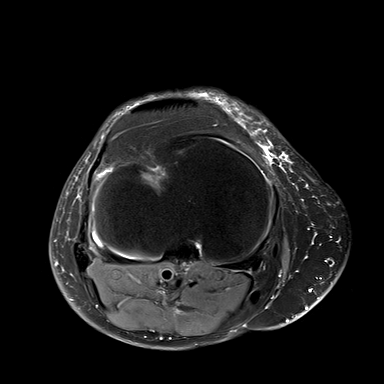
[im 18/36]
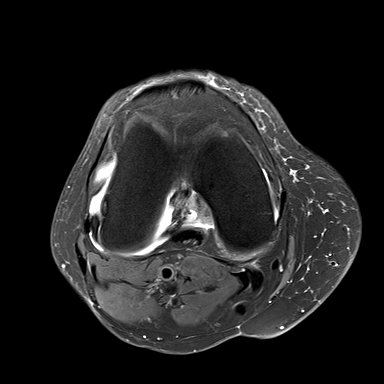
[im 22/36]
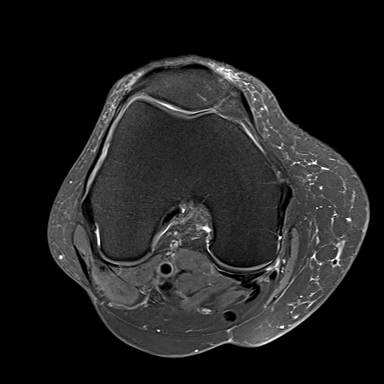
[im 27/36]
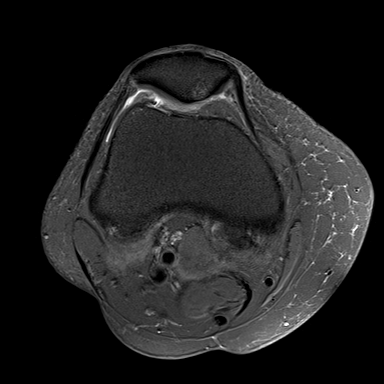
[im 31/36]
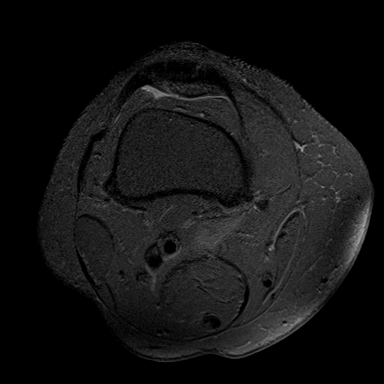
[im 36/36]
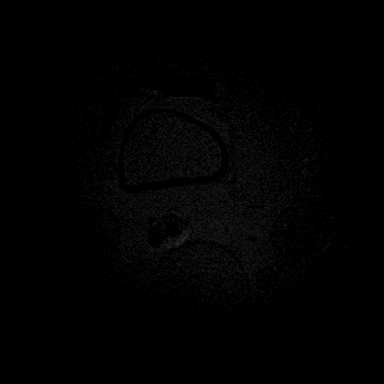

[Series 8: PD fat-sat · coronal · right · 3.0mm · 0.33mm/px · 8 of 28 slices shown (2 of 3)]
[im 1/28]
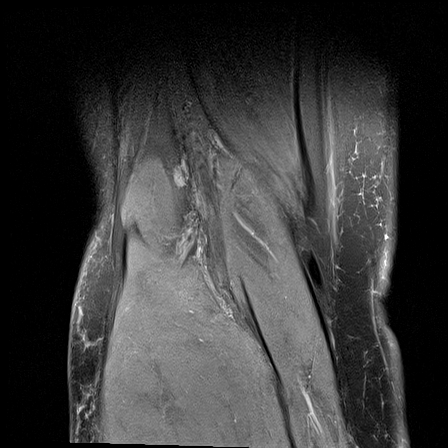
[im 4/28]
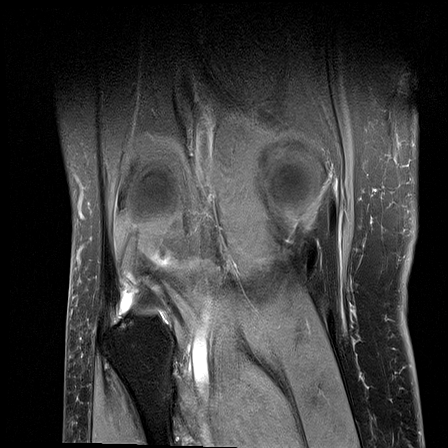
[im 8/28]
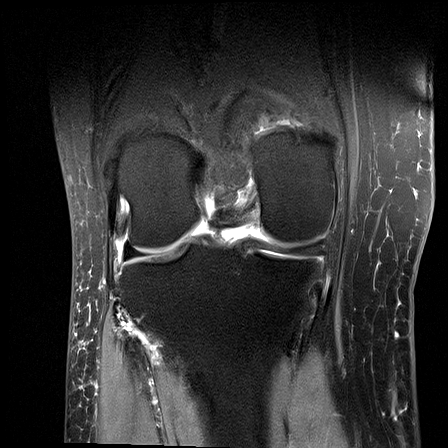
[im 12/28]
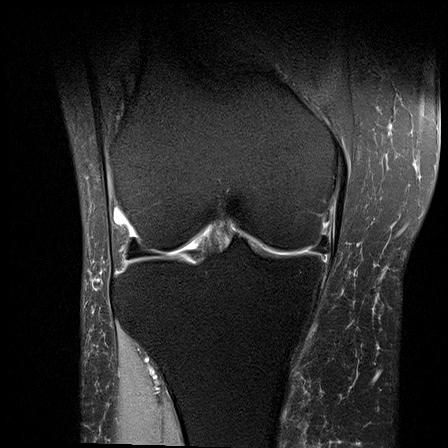
[im 16/28]
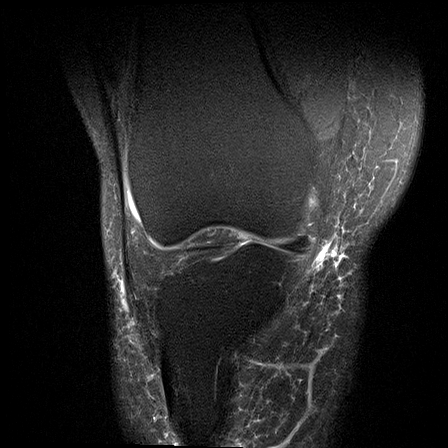
[im 20/28]
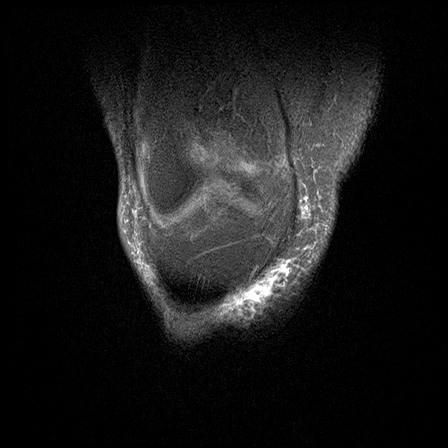
[im 24/28]
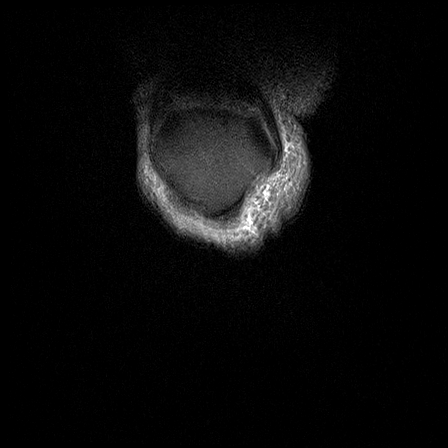
[im 28/28]
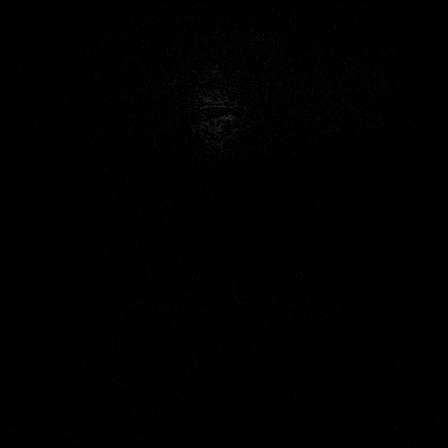

[Series 9: PD fat-sat · sagittal · right · 3.0mm · 0.39mm/px · 7 of 27 slices shown (3 of 3)]
[im 1/27]
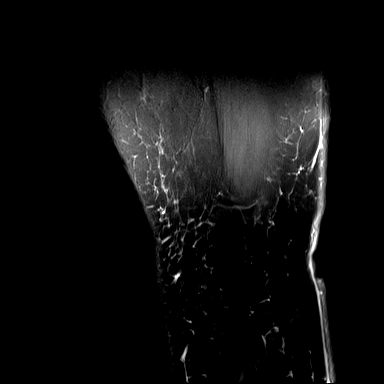
[im 5/27]
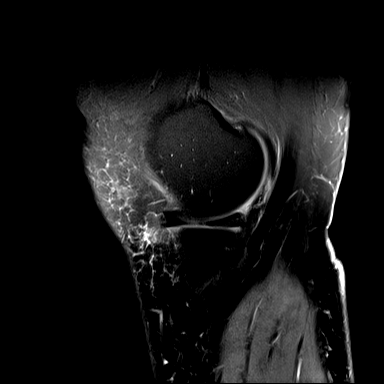
[im 9/27]
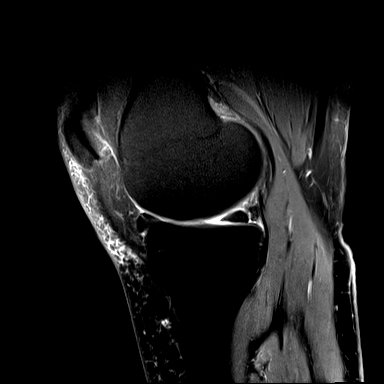
[im 14/27]
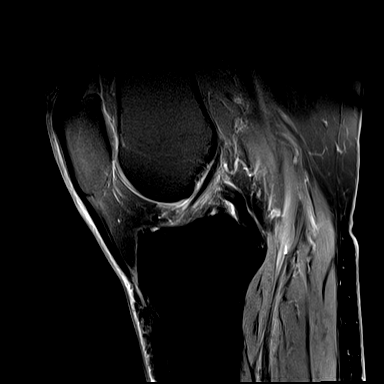
[im 18/27]
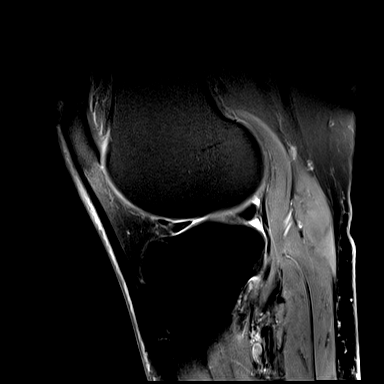
[im 22/27]
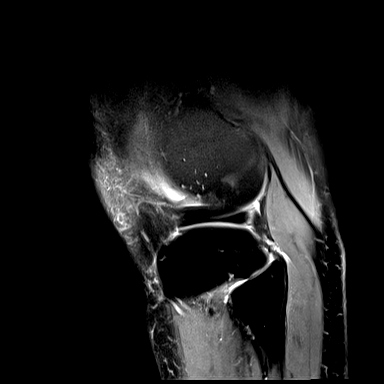
[im 27/27]
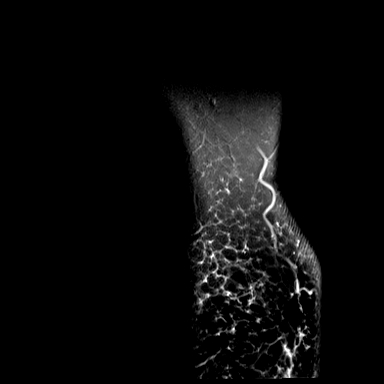

[Series 10: T2 fat-sat · coronal · right · 3.0mm · 0.39mm/px · 8 of 28 slices shown]
[im 1/28]
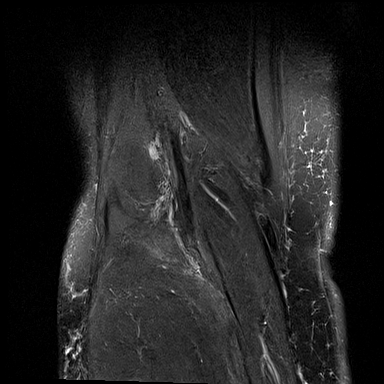
[im 4/28]
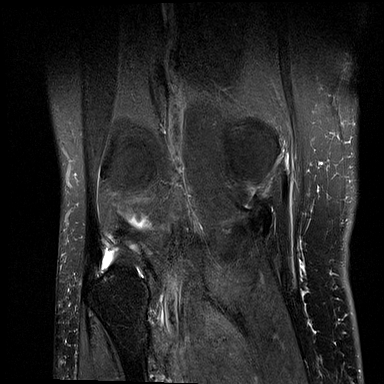
[im 8/28]
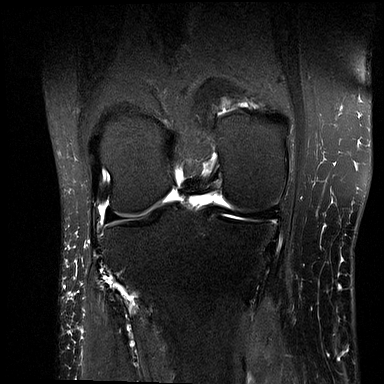
[im 12/28]
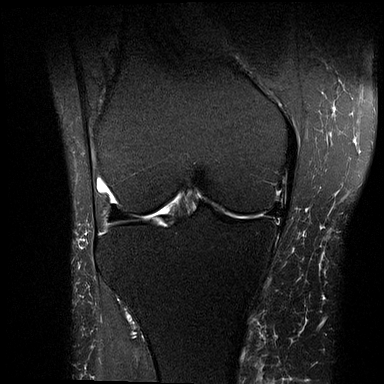
[im 16/28]
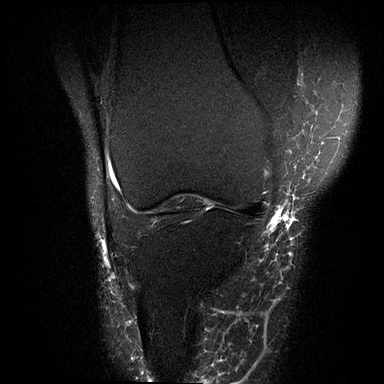
[im 20/28]
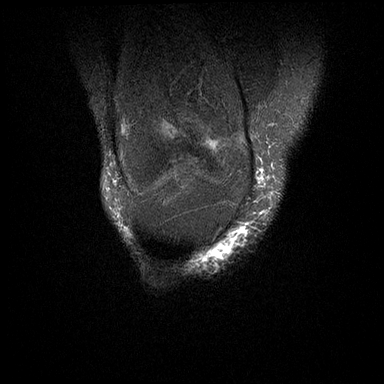
[im 24/28]
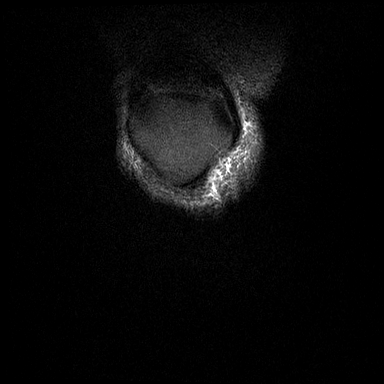
[im 28/28]
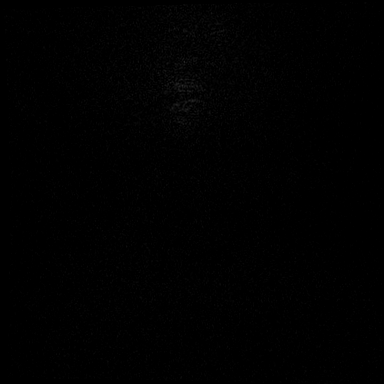

[32 of 40 positions shown; findings below may reference images not displayed]

FINDINGS: MENISCI

Medial meniscus: Mild degenerative signal is seen in the posterior
horn and there is some fraying along the free edge but no tear is
identified.

Lateral meniscus:  Intact.

LIGAMENTS

Cruciates:  Intact.

Collaterals:  Intact.

CARTILAGE

Patellofemoral: There is patellofemoral degenerative change with
hyaline cartilage loss seen in the superior pole of the patella at
the apex with some tiny underlying subchondral cysts.

Medial:  Unremarkable.

Lateral:  Unremarkable.

Joint:  No joint effusion.

Popliteal Fossa:  No Baker's cyst.

Extensor Mechanism:  Intact.

Bones:  Normal throughout.
IMPRESSION: Mild degenerative signal in the medial meniscus with some fraying
along the free edge but no tear.

Mild patellofemoral osteoarthritis.

## 2017-01-12 DIAGNOSIS — E7800 Pure hypercholesterolemia, unspecified: Secondary | ICD-10-CM | POA: Insufficient documentation

## 2017-01-12 DIAGNOSIS — M8580 Other specified disorders of bone density and structure, unspecified site: Secondary | ICD-10-CM | POA: Insufficient documentation

## 2017-01-12 DIAGNOSIS — K219 Gastro-esophageal reflux disease without esophagitis: Secondary | ICD-10-CM | POA: Insufficient documentation

## 2017-02-07 HISTORY — PX: CATARACT EXTRACTION, BILATERAL: SHX1313

## 2017-05-04 ENCOUNTER — Other Ambulatory Visit: Payer: Self-pay

## 2017-05-04 ENCOUNTER — Encounter: Payer: Self-pay | Admitting: Obstetrics & Gynecology

## 2017-05-04 ENCOUNTER — Other Ambulatory Visit (HOSPITAL_COMMUNITY)
Admission: RE | Admit: 2017-05-04 | Discharge: 2017-05-04 | Disposition: A | Discharge status: Home / Self Care | Payer: Medicare Other | Source: Ambulatory Visit | Attending: Obstetrics & Gynecology | Admitting: Obstetrics & Gynecology

## 2017-05-04 ENCOUNTER — Ambulatory Visit (INDEPENDENT_AMBULATORY_CARE_PROVIDER_SITE_OTHER): Payer: Medicare Other | Admitting: Obstetrics & Gynecology

## 2017-05-04 VITALS — BP 120/84 | HR 74 | Resp 16 | Ht 64.0 in | Wt 150.0 lb

## 2017-05-04 DIAGNOSIS — Z124 Encounter for screening for malignant neoplasm of cervix: Secondary | ICD-10-CM | POA: Insufficient documentation

## 2017-05-04 DIAGNOSIS — Z01419 Encounter for gynecological examination (general) (routine) without abnormal findings: Secondary | ICD-10-CM

## 2017-05-04 NOTE — Progress Notes (Signed)
77 y.o. G0P0000 MarriedCaucasianF here for annual exam.  Doing well.  No vaginal bleeding.  Only issue this past year was a very mild case of cellulitis in the upper portion of her right arm.    On suppressive antibiotic with Dr. Matilde Sprang.  Will stop this next week and has follow-up scheduled in May.  Moved to [place] in November.  Sold home in October.  Sold in two days.    PCP:  Dr. Felipa Eth.  Blood work was done in February.  This was really good per pt report.  Patient's last menstrual period was 02/07/1989.          Sexually active: No.  The current method of family planning is post menopausal status.    Exercising: Yes.    walking Smoker:  no  Health Maintenance: Pap:  10/06/14 Neg  History of abnormal Pap:  no MMG:  12/19/16 BIRADS2:Benign  Colonoscopy:  11/2007 normal. No f/u needed  BMD:   12/12/16 Osteopenia  TDaP:  2014 Pneumonia vaccine(s):  Done 2018 Shingrix:  Completed 2018 Hep C testing: n/a Screening Labs: PCP   reports that she has never smoked. She has never used smokeless tobacco. She reports that she does not drink alcohol or use drugs.  Past Medical History:  Diagnosis Date  . Breast cancer ([place]) 2003   right-lumpectomy  . Cellulitis of left arm    right arm not left   . Femoral hernia 1992  . GERD (gastroesophageal reflux disease)   . Glaucoma    low pressure  . Lymphedema of arm 04/2006   Right arm, Quit arimidex 07/2007  . PONV (postoperative nausea and vomiting)    primarily nausea     Past Surgical History:  Procedure Laterality Date  . AXILLARY LYMPH NODE DISSECTION Right 10/18/2001  . BREAST LUMPECTOMY  10/2001   lumpectomy, sentinel node bx, arimidex  . BUNIONECTOMY  1980  . CATARACT EXTRACTION, BILATERAL  2019  . DILATATION & CURETTAGE/HYSTEROSCOPY WITH MYOSURE N/A 10/27/2014   Procedure: DILATATION & CURETTAGE/HYSTEROSCOPY WITH RESECTOCOPE AND MYOSURE ;  Surgeon: Megan Salon, MD;  Location: [facility];  Service: Gynecology;   Laterality: N/A;  . FEMORAL HERNIA REPAIR  1992    Current Outpatient Medications  Medication Sig Dispense Refill  . atorvastatin (LIPITOR) 10 MG tablet Take 10 mg by mouth daily.     . Cholecalciferol (VITAMIN D) 2000 UNITS CAPS Take 2,000 Units by mouth daily.    . Coenzyme Q10 (CO Q 10 PO) Take 1 capsule by mouth daily. 100mg     . moxifloxacin (VIGAMOX) 0.5 % ophthalmic solution     . Multiple Vitamins-Minerals (CENTRUM SILVER PO) Take 1 tablet by mouth daily.     . nitrofurantoin (MACRODANTIN) 100 MG capsule Take 1 capsule by mouth 2 (two) times daily. Treatment completed by 05/10/17    . prednisoLONE acetate (PRED FORTE) 1 % ophthalmic suspension     . timolol (TIMOPTIC) 0.5 % ophthalmic solution Place 1 drop into both eyes 2 (two) times daily.     . TRAVATAN Z 0.004 % SOLN ophthalmic solution      No current facility-administered medications for this visit.     Family History  Problem Relation Age of Onset  . Hypertension Mother   . Osteoporosis Mother   . Heart failure Mother   . Asthma Brother   . Emphysema Brother   . Cancer - Prostate Brother   . Diabetes Mellitus II Sister     Review of Systems  All other systems reviewed and are negative.   Exam:   BP 120/84 (BP Location: Left Arm, Patient Position: Sitting, Cuff Size: Normal)   Pulse 74   Resp 16   Ht 5\' 4"  (1.626 m)   Wt 150 lb (68 kg)   LMP 02/07/1989   BMI 25.75 kg/m    Height: 5\' 4"  (162.6 cm)  Ht Readings from Last 3 Encounters:  05/04/17 5\' 4"  (1.626 m)  04/25/16 5' 3.5" (1.613 m)  12/08/15 5' 3.5" (1.613 m)    General appearance: alert, cooperative and appears stated age Head: Normocephalic, without obvious abnormality, atraumatic Neck: no adenopathy, supple, symmetrical, trachea midline and thyroid normal to inspection and palpation Lungs: clear to auscultation bilaterally Breasts: Right well healed incisions and radiation changes on the right--stable, left breast without masses, no skin  changes, no LAD Heart: regular rate and rhythm Abdomen: soft, non-tender; bowel sounds normal; no masses,  no organomegaly Extremities: extremities normal, atraumatic, no cyanosis or edema Skin: Skin color, texture, turgor normal. No rashes or lesions Lymph nodes: Cervical, supraclavicular, and axillary nodes normal. No abnormal inguinal nodes palpated Neurologic: Grossly normal  Pelvic: External genitalia:  no lesions              Urethra:  normal appearing urethra with no masses, tenderness or lesions              Bartholins and Skenes: normal                 Vagina: normal appearing vagina with normal color and discharge, no lesions              Cervix: no lesions              Pap taken: Yes.   Bimanual Exam:  Uterus:  normal size, contour, position, consistency, mobility, non-tender              Adnexa: normal adnexa and no mass, fullness, tenderness               Rectovaginal: Confirms               Anus:  normal sphincter tone, no lesions  Chaperone was present for exam.  A:  Well Woman with normal exam PMP, no HRT H/O breast cancer 9/03, sp lumpectomy and SNB, s/p chemo and radiation, then Arimdex for five years after Chronic right arm lymphedema Elevated lipids S/p hysteroscopy with polyp resection 9/16 Possible TIA with neg evaluation with neurology Recurrent UTIs, followed by Dr. Matilde Sprang  P:   Mammogram guidelines reviewed.  Doing yearly. pap smear obtained today Lab work and vaccines are UTD On macrobid until next week and then has follow up with Dr. Matilde Sprang return annually or prn

## 2017-05-05 LAB — CYTOLOGY - PAP: Diagnosis: NEGATIVE

## 2017-08-28 ENCOUNTER — Telehealth: Payer: Self-pay | Admitting: Obstetrics & Gynecology

## 2017-08-28 NOTE — Telephone Encounter (Signed)
Patient would like to see Dr.Miller for a breast issue. Patient said "I found something that I would like Dr.Miller to take a look at".

## 2017-08-28 NOTE — Telephone Encounter (Signed)
Spoke with patient. Reports painful lump in left breast, noticed 3-4 wks ago. Hx of lumpectomy in right breast in 2003, is concerned, requesting OV.   Denies any skin changes or nipple d/c.   OV scheduled for 7/24 at 9:45am with Dr. Sabra Heck.   Routing to provider for final review. Patient is agreeable to disposition. Will close encounter.

## 2017-08-30 ENCOUNTER — Encounter: Payer: Self-pay | Admitting: Obstetrics & Gynecology

## 2017-08-30 ENCOUNTER — Ambulatory Visit: Payer: Medicare Other | Admitting: Obstetrics & Gynecology

## 2017-08-30 VITALS — BP 110/72 | HR 80 | Resp 16 | Ht 64.0 in | Wt 150.8 lb

## 2017-08-30 DIAGNOSIS — N644 Mastodynia: Secondary | ICD-10-CM | POA: Diagnosis not present

## 2017-08-30 NOTE — Progress Notes (Signed)
GYNECOLOGY  VISIT  CC:   Breast lump/pain  HPI: 77 y.o. G0P0000 Married Caucasian female here for breast lump.  She does have some pain when she lifts her arm over her head.  Denies nipple discharge.  Had  No recent trauma.  Denies skin changes.  Last MMG 11/18.  H/o breast cancer on the right.  Does have some anxiety due to prior hx.  GYNECOLOGIC HISTORY: Patient's last menstrual period was 02/07/1989. Contraception: post menopausal  Menopausal hormone therapy: none  Patient Active Problem List   Diagnosis Date Noted  . GERD (gastroesophageal reflux disease) 01/12/2017  . Hypercholesterolemia 01/12/2017  . Osteopenia 01/12/2017  . Glaucoma 10/06/2014  . History of breast cancer in female 11/26/2012    Past Medical History:  Diagnosis Date  . Breast cancer ([place]) 2003   right-lumpectomy  . Cellulitis of left arm    right arm not left   . Femoral hernia 1992  . GERD (gastroesophageal reflux disease)   . Glaucoma    low pressure  . Lymphedema of arm 04/2006   Right arm, Quit arimidex 07/2007  . PONV (postoperative nausea and vomiting)    primarily nausea     Past Surgical History:  Procedure Laterality Date  . AXILLARY LYMPH NODE DISSECTION Right 10/18/2001  . BREAST LUMPECTOMY  10/2001   lumpectomy, sentinel node bx, arimidex  . BUNIONECTOMY  1980  . CATARACT EXTRACTION, BILATERAL  2019  . DILATATION & CURETTAGE/HYSTEROSCOPY WITH MYOSURE N/A 10/27/2014   Procedure: DILATATION & CURETTAGE/HYSTEROSCOPY WITH RESECTOCOPE AND MYOSURE ;  Surgeon: Megan Salon, MD;  Location: [facility];  Service: Gynecology;  Laterality: N/A;  . FEMORAL HERNIA REPAIR  1992    MEDS:   Current Outpatient Medications on File Prior to Visit  Medication Sig Dispense Refill  . atorvastatin (LIPITOR) 10 MG tablet Take 10 mg by mouth daily.     . Cholecalciferol (VITAMIN D) 2000 UNITS CAPS Take 2,000 Units by mouth daily.    . Coenzyme Q10 (CO Q 10 PO) Take 1 capsule by mouth daily. 100mg     .  Multiple Vitamins-Minerals (PRESERVISION AREDS 2 PO) Take by mouth daily.    . timolol (TIMOPTIC) 0.5 % ophthalmic solution Place 1 drop into both eyes 2 (two) times daily.     . TRAVATAN Z 0.004 % SOLN ophthalmic solution      No current facility-administered medications on file prior to visit.     ALLERGIES: Tape; Codeine; Meloxicam; Nsaids; Sulfa antibiotics; Sulfasalazine; Tolmetin; and Trimethoprim  Family History  Problem Relation Age of Onset  . Hypertension Mother   . Osteoporosis Mother   . Heart failure Mother   . Asthma Brother   . Emphysema Brother   . Cancer - Prostate Brother   . Diabetes Mellitus II Sister     SH:  Married, non smoker  Review of Systems  Skin: Positive for itching and rash.       Breast lump Breast pain   All other systems reviewed and are negative.   PHYSICAL EXAMINATION:    BP 110/72 (BP Location: Left Arm, Patient Position: Sitting, Cuff Size: Large)   Pulse 80   Resp 16   Ht 5\' 4"  (1.626 m)   Wt 150 lb 12.8 oz (68.4 kg)   LMP 02/07/1989   BMI 25.88 kg/m     Physical Exam  Constitutional: She appears well-developed and well-nourished.  Neck: Normal range of motion. Neck supple. No thyromegaly present.  Pulmonary/Chest: Right breast exhibits skin change (  radiation changes). Right breast exhibits no inverted nipple, no mass, no nipple discharge and no tenderness. Left breast exhibits tenderness. Left breast exhibits no inverted nipple, no mass, no nipple discharge and no skin change.      Assessment: Breast tenderness in pt with prior hx of breast cancer  Plan: Diagnostic imaging on the left ordered.     ~15 minutes spent with patient >50% of time was in face to face discussion of above.

## 2017-08-30 NOTE — Progress Notes (Signed)
Patient scheduled while in office at [facility] on 08/31/17 at 1:30pm for left breast Dx MMG and [place] if needed.  Patient declined earlier appt offered. Order faxed to [facility]. Patient verbalizes understanding and is agreeable.

## 2017-09-12 ENCOUNTER — Encounter: Payer: Self-pay | Admitting: Obstetrics & Gynecology

## 2017-12-08 ENCOUNTER — Other Ambulatory Visit: Payer: Self-pay | Admitting: Orthopedic Surgery

## 2017-12-12 ENCOUNTER — Encounter (HOSPITAL_BASED_OUTPATIENT_CLINIC_OR_DEPARTMENT_OTHER): Payer: Self-pay | Admitting: *Deleted

## 2017-12-12 ENCOUNTER — Other Ambulatory Visit: Payer: Self-pay

## 2017-12-14 ENCOUNTER — Encounter (HOSPITAL_BASED_OUTPATIENT_CLINIC_OR_DEPARTMENT_OTHER)
Admission: RE | Disposition: A | Discharge status: Home / Self Care | Payer: Self-pay | Source: Ambulatory Visit | Attending: Orthopedic Surgery

## 2017-12-14 ENCOUNTER — Encounter (HOSPITAL_BASED_OUTPATIENT_CLINIC_OR_DEPARTMENT_OTHER): Payer: Self-pay

## 2017-12-14 ENCOUNTER — Ambulatory Visit (HOSPITAL_BASED_OUTPATIENT_CLINIC_OR_DEPARTMENT_OTHER): Payer: Medicare Other | Admitting: Anesthesiology

## 2017-12-14 ENCOUNTER — Other Ambulatory Visit: Payer: Self-pay

## 2017-12-14 ENCOUNTER — Ambulatory Visit (HOSPITAL_BASED_OUTPATIENT_CLINIC_OR_DEPARTMENT_OTHER)
Admission: RE | Admit: 2017-12-14 | Discharge: 2017-12-14 | Disposition: A | Discharge status: Home / Self Care | Payer: Medicare Other | Source: Ambulatory Visit | Attending: Orthopedic Surgery | Admitting: Orthopedic Surgery

## 2017-12-14 DIAGNOSIS — Z79899 Other long term (current) drug therapy: Secondary | ICD-10-CM | POA: Insufficient documentation

## 2017-12-14 DIAGNOSIS — M65331 Trigger finger, right middle finger: Secondary | ICD-10-CM | POA: Insufficient documentation

## 2017-12-14 DIAGNOSIS — Z882 Allergy status to sulfonamides status: Secondary | ICD-10-CM | POA: Insufficient documentation

## 2017-12-14 DIAGNOSIS — Z853 Personal history of malignant neoplasm of breast: Secondary | ICD-10-CM | POA: Insufficient documentation

## 2017-12-14 DIAGNOSIS — Z888 Allergy status to other drugs, medicaments and biological substances status: Secondary | ICD-10-CM | POA: Insufficient documentation

## 2017-12-14 DIAGNOSIS — K219 Gastro-esophageal reflux disease without esophagitis: Secondary | ICD-10-CM | POA: Diagnosis not present

## 2017-12-14 DIAGNOSIS — M67441 Ganglion, right hand: Secondary | ICD-10-CM | POA: Diagnosis not present

## 2017-12-14 HISTORY — PX: MASS EXCISION: SHX2000

## 2017-12-14 HISTORY — PX: TRIGGER FINGER RELEASE: SHX641

## 2017-12-14 SURGERY — RELEASE, A1 PULLEY, FOR TRIGGER FINGER
Anesthesia: Monitor Anesthesia Care | Site: Hand | Laterality: Right

## 2017-12-14 MED ORDER — FENTANYL CITRATE (PF) 100 MCG/2ML IJ SOLN: 25.0000 ug | INTRAMUSCULAR | Status: DC | PRN

## 2017-12-14 MED ORDER — MEPERIDINE HCL 25 MG/ML IJ SOLN: 6.2500 mg | INTRAMUSCULAR | Status: DC | PRN

## 2017-12-14 MED ORDER — ONDANSETRON HCL 4 MG/2ML IJ SOLN
INTRAMUSCULAR | Status: AC
  Filled 2017-12-14: qty 2

## 2017-12-14 MED ORDER — CHLORHEXIDINE GLUCONATE 4 % EX LIQD: 60.0000 mL | Freq: Once | CUTANEOUS | Status: DC

## 2017-12-14 MED ORDER — LIDOCAINE 2% (20 MG/ML) 5 ML SYRINGE
INTRAMUSCULAR | Status: AC
  Filled 2017-12-14: qty 5

## 2017-12-14 MED ORDER — LACTATED RINGERS IV SOLN
INTRAVENOUS | Status: DC
  Administered 2017-12-14: 11:00:00 via INTRAVENOUS

## 2017-12-14 MED ORDER — FENTANYL CITRATE (PF) 100 MCG/2ML IJ SOLN
50.0000 ug | INTRAMUSCULAR | Status: DC | PRN
  Administered 2017-12-14: 50 ug via INTRAVENOUS

## 2017-12-14 MED ORDER — MIDAZOLAM HCL 2 MG/2ML IJ SOLN: 1.0000 mg | INTRAMUSCULAR | Status: DC | PRN

## 2017-12-14 MED ORDER — CEPHALEXIN 500 MG PO CAPS: 500.0000 mg | ORAL_CAPSULE | Freq: Three times a day (TID) | ORAL | 0 refills | Status: AC

## 2017-12-14 MED ORDER — LIDOCAINE 2% (20 MG/ML) 5 ML SYRINGE
INTRAMUSCULAR | Status: DC | PRN
  Administered 2017-12-14: 50 mg via INTRAVENOUS

## 2017-12-14 MED ORDER — PROPOFOL 10 MG/ML IV BOLUS
INTRAVENOUS | Status: DC | PRN
  Administered 2017-12-14: 100 mg via INTRAVENOUS

## 2017-12-14 MED ORDER — HYDROCODONE-ACETAMINOPHEN 5-325 MG PO TABS: ORAL_TABLET | ORAL | 0 refills | Status: DC

## 2017-12-14 MED ORDER — CEFAZOLIN SODIUM 1 G IJ SOLR
INTRAMUSCULAR | Status: AC
  Filled 2017-12-14: qty 20

## 2017-12-14 MED ORDER — FENTANYL CITRATE (PF) 100 MCG/2ML IJ SOLN
INTRAMUSCULAR | Status: AC
  Filled 2017-12-14: qty 2

## 2017-12-14 MED ORDER — BUPIVACAINE HCL (PF) 0.25 % IJ SOLN
INTRAMUSCULAR | Status: DC | PRN
  Administered 2017-12-14: 5 mL

## 2017-12-14 MED ORDER — CEFAZOLIN SODIUM-DEXTROSE 2-4 GM/100ML-% IV SOLN: 2.0000 g | INTRAVENOUS | Status: DC

## 2017-12-14 MED ORDER — PROPOFOL 500 MG/50ML IV EMUL
INTRAVENOUS | Status: AC
  Filled 2017-12-14: qty 50

## 2017-12-14 MED ORDER — SCOPOLAMINE 1 MG/3DAYS TD PT72: 1.0000 | MEDICATED_PATCH | Freq: Once | TRANSDERMAL | Status: DC | PRN

## 2017-12-14 MED ORDER — ONDANSETRON HCL 4 MG/2ML IJ SOLN
INTRAMUSCULAR | Status: DC | PRN
  Administered 2017-12-14: 4 mg via INTRAVENOUS

## 2017-12-14 SURGICAL SUPPLY — 55 items
BANDAGE ACE 3X5.8 VEL STRL LF (GAUZE/BANDAGES/DRESSINGS) IMPLANT
BANDAGE COBAN STERILE 2 (GAUZE/BANDAGES/DRESSINGS) ×3 IMPLANT
BENZOIN TINCTURE PRP APPL 2/3 (GAUZE/BANDAGES/DRESSINGS) IMPLANT
BLADE MINI RND TIP GREEN BEAV (BLADE) IMPLANT
BLADE SURG 15 STRL LF DISP TIS (BLADE) ×4 IMPLANT
BLADE SURG 15 STRL SS (BLADE) ×2
BNDG COHESIVE 1X5 TAN STRL LF (GAUZE/BANDAGES/DRESSINGS) IMPLANT
BNDG CONFORM 2 STRL LF (GAUZE/BANDAGES/DRESSINGS) IMPLANT
BNDG ELASTIC 2X5.8 VLCR STR LF (GAUZE/BANDAGES/DRESSINGS) IMPLANT
BNDG ESMARK 4X9 LF (GAUZE/BANDAGES/DRESSINGS) ×3 IMPLANT
BNDG GAUZE 1X2.1 STRL (MISCELLANEOUS) IMPLANT
BNDG GAUZE ELAST 4 BULKY (GAUZE/BANDAGES/DRESSINGS) IMPLANT
BNDG PLASTER X FAST 3X3 WHT LF (CAST SUPPLIES) IMPLANT
CHLORAPREP W/TINT 26ML (MISCELLANEOUS) ×3 IMPLANT
CORD BIPOLAR FORCEPS 12FT (ELECTRODE) ×3 IMPLANT
COVER BACK TABLE 60X90IN (DRAPES) ×3 IMPLANT
COVER MAYO STAND STRL (DRAPES) ×3 IMPLANT
COVER WAND RF STERILE (DRAPES) IMPLANT
CUFF TOURNIQUET SINGLE 18IN (TOURNIQUET CUFF) ×3 IMPLANT
DRAPE EXTREMITY T 121X128X90 (DRAPE) ×3 IMPLANT
DRAPE SURG 17X23 STRL (DRAPES) ×3 IMPLANT
GAUZE SPONGE 4X4 12PLY STRL (GAUZE/BANDAGES/DRESSINGS) ×3 IMPLANT
GAUZE XEROFORM 1X8 LF (GAUZE/BANDAGES/DRESSINGS) ×3 IMPLANT
GLOVE BIO SURGEON STRL SZ7.5 (GLOVE) ×3 IMPLANT
GLOVE BIOGEL PI IND STRL 7.0 (GLOVE) ×2 IMPLANT
GLOVE BIOGEL PI IND STRL 8 (GLOVE) ×2 IMPLANT
GLOVE BIOGEL PI INDICATOR 7.0 (GLOVE) ×1
GLOVE BIOGEL PI INDICATOR 8 (GLOVE) ×1
GLOVE EXAM NITRILE MD LF STRL (GLOVE) ×3 IMPLANT
GLOVE SURG SYN 7.5  E (GLOVE) ×1
GLOVE SURG SYN 7.5 E (GLOVE) ×2 IMPLANT
GOWN STRL REUS W/ TWL LRG LVL3 (GOWN DISPOSABLE) IMPLANT
GOWN STRL REUS W/ TWL XL LVL3 (GOWN DISPOSABLE) ×2 IMPLANT
GOWN STRL REUS W/TWL LRG LVL3 (GOWN DISPOSABLE)
GOWN STRL REUS W/TWL XL LVL3 (GOWN DISPOSABLE) ×4 IMPLANT
NEEDLE HYPO 25X1 1.5 SAFETY (NEEDLE) ×3 IMPLANT
NS IRRIG 1000ML POUR BTL (IV SOLUTION) ×3 IMPLANT
PACK BASIN DAY SURGERY FS (CUSTOM PROCEDURE TRAY) ×3 IMPLANT
PAD CAST 3X4 CTTN HI CHSV (CAST SUPPLIES) IMPLANT
PAD CAST 4YDX4 CTTN HI CHSV (CAST SUPPLIES) IMPLANT
PADDING CAST ABS 4INX4YD NS (CAST SUPPLIES)
PADDING CAST ABS COTTON 4X4 ST (CAST SUPPLIES) IMPLANT
PADDING CAST COTTON 3X4 STRL (CAST SUPPLIES)
PADDING CAST COTTON 4X4 STRL (CAST SUPPLIES)
STOCKINETTE 4X48 STRL (DRAPES) ×3 IMPLANT
STRIP CLOSURE SKIN 1/2X4 (GAUZE/BANDAGES/DRESSINGS) IMPLANT
SUT ETHILON 3 0 PS 1 (SUTURE) IMPLANT
SUT ETHILON 4 0 PS 2 18 (SUTURE) ×3 IMPLANT
SUT ETHILON 5 0 P 3 18 (SUTURE)
SUT NYLON ETHILON 5-0 P-3 1X18 (SUTURE) IMPLANT
SUT VIC AB 4-0 P2 18 (SUTURE) IMPLANT
SYR BULB 3OZ (MISCELLANEOUS) ×3 IMPLANT
SYR CONTROL 10ML LL (SYRINGE) ×3 IMPLANT
TOWEL GREEN STERILE FF (TOWEL DISPOSABLE) ×6 IMPLANT
UNDERPAD 30X30 (UNDERPADS AND DIAPERS) IMPLANT

## 2017-12-14 NOTE — Op Note (Signed)
12/14/2017 [facility]  Operative Note  PREOPERATIVE DIAGNOSIS: RIGHT LONG FINGER TRIGGER DIGIT, RIGHT ANNULAR LIGAMENT CYST  POSTOPERATIVE DIAGNOSIS:  RIGHT LONG FINGER TRIGGER DIGIT, RIGHT ANNULAR LIGAMENT CYST  PROCEDURE: Procedure(s): 1. RELEASE TRIGGER FINGER/A-1 PULLEY RIGHT LONG FINGER 2. EXCISION ANNULAR LIGAMENT cyst right long finger  SURGEON:  Leanora Cover, MD  ASSISTANT:  none.  ANESTHESIA:  General.  IV FLUIDS:  Per anesthesia flow sheet.  ESTIMATED BLOOD LOSS:  Minimal.  COMPLICATIONS:  None.  SPECIMENS:  Right long finger mass to pathology  TOURNIQUET TIME: Esmarch tourniquet at wrist: 16 minutes  DISPOSITION:  Stable to PACU.  LOCATION: [facility]  INDICATIONS: Megan Graham is a 77 y.o. female with triggering right long finger and mass at base of right long finger.  She wishes to have a trigger release and excision of the mass.  Risks, benefits and alternatives of surgery were discussed including the risk of blood loss, infection, damage to nerves, vessels, tendons, ligaments, bone, failure of surgery, need for additional surgery, complications with wound healing, continued pain, continued triggering and need for repeat surgery, recurrence of triggering or mass.  She voiced understanding of these risks and elected to proceed.  OPERATIVE COURSE:  After being identified preoperatively by myself, the patient and I agreed upon the procedure and site of procedure.  The surgical site was marked. The risks, benefits, and alternatives of surgery were reviewed and she wished to proceed.  Surgical consent had been signed. She was given IV Ancef as preoperative antibiotic prophylaxis. She was transported to the operating room and placed on the operating room table in supine position with the Right upper extremity on an arm board. General anesthesia was induced by the anesthesiologist.  The Right upper extremity was prepped and draped in normal  sterile orthopedic fashion. A surgical pause was performed between surgeons, anesthesia, and operating room staff, and all were in agreement as to the patient, procedure, and site of procedure.  An Esmarch bandage was used as a tourniquet at the wrist.  An incision was made at the volar aspect of the MP joint of the long finger this was extended over the proximal phalanx in a [place] fashion.  This was carried into the subcutaneous tissues by preading technique.  Bipolar electrocautery was used to obtain hemostasis.  The radial and ulnar digital nerves were protected throughout the case. The flexor sheath was identified.  The A1 pulley was identified and sharply incised.  It was released in its entirety.  The proximal 1-2 mm of the A2 pulley was vented to allow better excursion of the tendons.  The finger was placed through a range of motion and there was noted to be no catching.  The tendons were brought through the wound and any adherences released.  The mass in the finger was coming from just distal to the A2 pulley.  The mass was removed after freeing it up from soft tissue attachments and sent to pathology for examination.  A small window of the tendon sheath where the mass was coming from was removed to try to prevent recurrence.  The wound was then copiously irrigated with sterile saline. It was closed with 4-0 nylon in a horizontal mattress fashion.  It was injected with 0.25% plain Marcaine to aid in postoperative analgesia.  It was dressed with sterile Xeroform, 4x4s, and wrapped lightly with a Coban dressing.  The Esmarch bandage was removed at 16 minutes.  The fingertips were pink with brisk  capillary refill after deflation of the tourniquet.  The operative drapes were broken down and the patient was awoken from anesthesia safely.  She was transferred back to the stretcher and taken to the PACU in stable condition.   I will see her back in the office in 1 week for postoperative followup.  I will give her  a prescription for Norco 5/325 1-2 tabs PO q6 hours prn pain, dispense # 20.  She is also concerned about developing cellulitis as she has in the past and requested a prescription for Keflex 500 mg p.o. 3 times daily x7 days.    Leanora Cover, MD Electronically signed, 12/14/17

## 2017-12-14 NOTE — Anesthesia Postprocedure Evaluation (Signed)
Anesthesia Post Note  Patient: Megan Graham  Procedure(s) Performed: RELEASE TRIGGER FINGER/A-1 PULLEY RIGHT LONG FINGER (Right Finger) EXCISION MASS RIGHT ANNULAR LIGAMENT (Right Hand)     Patient location during evaluation: PACU Anesthesia Type: General Level of consciousness: awake and alert Pain management: pain level controlled Vital Signs Assessment: post-procedure vital signs reviewed and stable Respiratory status: spontaneous breathing, nonlabored ventilation, respiratory function stable and patient connected to nasal cannula oxygen Cardiovascular status: stable and blood pressure returned to baseline Postop Assessment: no apparent nausea or vomiting Anesthetic complications: no    Last Vitals:  Vitals:   12/14/17 1300 12/14/17 1310  BP: (!) 156/80 (!) 145/66  Pulse: 70 72  Resp: 11 16  Temp:  36.6 C  SpO2: 100% 100%    Last Pain:  Vitals:   12/14/17 1310  TempSrc:   PainSc: 0-No pain                 Jaxxson Cavanah

## 2017-12-14 NOTE — Transfer of Care (Signed)
Immediate Anesthesia Transfer of Care Note  Patient: Megan Graham  Procedure(s) Performed: RELEASE TRIGGER FINGER/A-1 PULLEY RIGHT LONG FINGER (Right Finger) EXCISION MASS RIGHT ANNULAR LIGAMENT (Right Hand)  Patient Location: PACU  Anesthesia Type:General  Level of Consciousness: awake, sedated and patient cooperative  Airway & Oxygen Therapy: Patient Spontanous Breathing  Post-op Assessment: Report given to RN and Post -op Vital signs reviewed and stable  Post vital signs: Reviewed and stable  Last Vitals:  Vitals Value Taken Time  BP 146/73 12/14/2017 12:31 PM  Temp    Pulse 74 12/14/2017 12:32 PM  Resp 12 12/14/2017 12:32 PM  SpO2 100 % 12/14/2017 12:32 PM  Vitals shown include unvalidated device data.  Last Pain:  Vitals:   12/14/17 1030  TempSrc: Oral  PainSc: 0-No pain         Complications: No apparent anesthesia complications

## 2017-12-14 NOTE — H&P (Signed)
Megan Graham is an 77 y.o. female.   Chief Complaint: right long finger trigger digit and cyst HPI: 77 yo female with triggering right long finger and cyst at base of finger.  Trigger has been injected twice with recurrence.  She wishes to have a trigger release and excision of the cyst.  Allergies:  Allergies  Allergen Reactions  . Tape Dermatitis    Paper tape ok Paper tape ok  . Codeine Nausea And Vomiting  . Meloxicam Rash  . Nsaids Rash  . Sulfa Antibiotics Rash  . Sulfasalazine Rash  . Tolmetin Rash  . Trimethoprim Rash    Past Medical History:  Diagnosis Date  . Breast cancer ([place]) 2003   right-lumpectomy  . Cellulitis of left arm    right arm not left   . Femoral hernia 1992  . GERD (gastroesophageal reflux disease)   . Glaucoma    low pressure  . Lymphedema of arm 04/2006   Right arm, Quit arimidex 07/2007  . PONV (postoperative nausea and vomiting)    primarily nausea     Past Surgical History:  Procedure Laterality Date  . AXILLARY LYMPH NODE DISSECTION Right 10/18/2001  . BREAST LUMPECTOMY  10/2001   lumpectomy, sentinel node bx, arimidex  . BUNIONECTOMY  1980  . CATARACT EXTRACTION, BILATERAL  2019  . DILATATION & CURETTAGE/HYSTEROSCOPY WITH MYOSURE N/A 10/27/2014   Procedure: DILATATION & CURETTAGE/HYSTEROSCOPY WITH RESECTOCOPE AND MYOSURE ;  Surgeon: Megan Salon, MD;  Location: [facility];  Service: Gynecology;  Laterality: N/A;  . FEMORAL HERNIA REPAIR  1992    Family History: Family History  Problem Relation Age of Onset  . Hypertension Mother   . Osteoporosis Mother   . Heart failure Mother   . Asthma Brother   . Emphysema Brother   . Cancer - Prostate Brother   . Diabetes Mellitus II Sister     Social History:   reports that she has never smoked. She has never used smokeless tobacco. She reports that she does not drink alcohol or use drugs.  Medications: Medications Prior to Admission  Medication Sig Dispense Refill  . atorvastatin  (LIPITOR) 10 MG tablet Take 10 mg by mouth daily.     . Cholecalciferol (VITAMIN D) 2000 UNITS CAPS Take 2,000 Units by mouth daily.    . Coenzyme Q10 (CO Q 10 PO) Take 1 capsule by mouth daily. 100mg     . Multiple Vitamins-Minerals (PRESERVISION AREDS 2 PO) Take by mouth daily.    . timolol (TIMOPTIC) 0.5 % ophthalmic solution Place 1 drop into both eyes 2 (two) times daily.     . TRAVATAN Z 0.004 % SOLN ophthalmic solution       No results found for this or any previous visit (from the past 48 hour(s)).  No results found.   A comprehensive review of systems was negative.  Blood pressure (!) 147/64, pulse 76, temperature 98 F (36.7 C), temperature source Oral, resp. rate 18, height 5\' 4"  (1.626 m), weight 69 kg, last menstrual period 02/07/1989, SpO2 100 %.  General appearance: alert, cooperative and appears stated age Head: Normocephalic, without obvious abnormality, atraumatic Neck: supple, symmetrical, trachea midline Cardio: regular rate and rhythm Resp: clear to auscultation bilaterally Extremities: Intact sensation and capillary refill all digits.  +epl/fpl/io.  No wounds.  Pulses: 2+ and symmetric Skin: Skin color, texture, turgor normal. No rashes or lesions Neurologic: Grossly normal Incision/Wound: none  Assessment/Plan Right long finger trigger digit and annular ligament cyst.  Non  operative and operative treatment options have been discussed with the patient and patient wishes to proceed with operative treatment. Risks, benefits, and alternatives of surgery have been discussed and the patient agrees with the plan of care.   Leanora Cover 12/14/2017, 10:58 AM

## 2017-12-14 NOTE — Discharge Instructions (Addendum)

## 2017-12-14 NOTE — Anesthesia Procedure Notes (Signed)
Procedure Name: LMA Insertion Date/Time: 12/14/2017 11:06 AM Performed by: Lyndee Leo, CRNA Pre-anesthesia Checklist: Patient identified, Emergency Drugs available, Suction available and Patient being monitored Patient Re-evaluated:Patient Re-evaluated prior to induction Oxygen Delivery Method: Circle system utilized Preoxygenation: Pre-oxygenation with 100% oxygen Induction Type: IV induction Ventilation: Mask ventilation without difficulty LMA: LMA inserted LMA Size: 3.0 Number of attempts: 1 Airway Equipment and Method: Bite block Placement Confirmation: positive ETCO2 Tube secured with: Tape Dental Injury: Teeth and Oropharynx as per pre-operative assessment

## 2017-12-14 NOTE — Anesthesia Preprocedure Evaluation (Addendum)
Anesthesia Evaluation  Patient identified by MRN, date of birth, ID band Patient awake    Reviewed: Allergy & Precautions, H&P , NPO status , Patient's Chart, lab work & pertinent test results  History of Anesthesia Complications (+) PONV and history of anesthetic complications  Airway Mallampati: I  TM Distance: >3 FB Neck ROM: full    Dental no notable dental hx. (+) Teeth Intact   Pulmonary neg pulmonary ROS,    Pulmonary exam normal        Cardiovascular negative cardio ROS Normal cardiovascular exam     Neuro/Psych negative neurological ROS  negative psych ROS   GI/Hepatic Neg liver ROS, GERD  ,  Endo/Other  negative endocrine ROS  Renal/GU negative Renal ROS     Musculoskeletal   Abdominal Normal abdominal exam  (+)   Peds  Hematology negative hematology ROS (+)   Anesthesia Other Findings   Reproductive/Obstetrics negative OB ROS                             Anesthesia Physical  Anesthesia Plan  ASA: II  Anesthesia Plan: MAC   Post-op Pain Management:    Induction: Intravenous  PONV Risk Score and Plan: 2 and Dexamethasone, Treatment may vary due to age or medical condition and Ondansetron  Airway Management Planned: Nasal Cannula and Natural Airway  Additional Equipment:   Intra-op Plan:   Post-operative Plan:   Informed Consent: I have reviewed the patients History and Physical, chart, labs and discussed the procedure including the risks, benefits and alternatives for the proposed anesthesia with the patient or authorized representative who has indicated his/her understanding and acceptance.     Plan Discussed with: CRNA, Surgeon and Anesthesiologist  Anesthesia Plan Comments:        Anesthesia Quick Evaluation

## 2017-12-15 ENCOUNTER — Encounter (HOSPITAL_BASED_OUTPATIENT_CLINIC_OR_DEPARTMENT_OTHER): Payer: Self-pay | Admitting: Orthopedic Surgery

## 2018-01-18 ENCOUNTER — Encounter: Payer: Self-pay | Admitting: Podiatry

## 2018-01-18 ENCOUNTER — Ambulatory Visit: Payer: Medicare Other | Admitting: Podiatry

## 2018-01-18 DIAGNOSIS — L600 Ingrowing nail: Secondary | ICD-10-CM

## 2018-01-18 MED ORDER — NEOMYCIN-POLYMYXIN-HC 3.5-10000-1 OT SOLN: OTIC | 0 refills | Status: DC

## 2018-01-18 NOTE — Progress Notes (Signed)
Subjective:   Patient ID: Megan Graham, female   DOB: 77 y.o.   MRN: [id number]   HPI Patient presents stating she has a painful ingrown toenail of her right big toe and she is tried to trim it and soak it without relief and it is making shoe gear difficult and is gradually worse over the last couple months   Review of Systems  All other systems reviewed and are negative.       Objective:  Physical Exam Vitals signs and nursing note reviewed.  Constitutional:      Appearance: She is well-developed.  Pulmonary:     Effort: Pulmonary effort is normal.  Musculoskeletal: Normal range of motion.  Skin:    General: Skin is warm.  Neurological:     Mental Status: She is alert.     Neurovascular status intact with muscle strength adequate range of motion within normal limits with patient noted to have incurvated medial border right hallux that is painful when pressed and is making shoe gear difficult.  Patient was noted to have good digital perfusion is well oriented x3     Assessment:  Chronic ingrown toenail deformity right hallux medial border with pain     Plan:  H&P condition reviewed and recommended correction of deformity and allow patient to read and signed consent form after review.  Today I infiltrated the right hallux 60 mg like Marcaine mixture sterile prep applied to the toe with sterile instrumentation remove the medial border exposed the matrix applied phenol 3 applications 36 followed by alcohol lavage sterile dressing and gave instructions on soaks therapy.  Encouraged to leave dressing on 24 hours but to take it off earlier if the toe should start to throb and placed on Corticosporin otic solution and encouraged patient to call with any questions concerns

## 2018-01-18 NOTE — Patient Instructions (Signed)

## 2018-04-09 ENCOUNTER — Encounter: Payer: Self-pay | Admitting: Obstetrics & Gynecology

## 2018-07-16 ENCOUNTER — Encounter: Payer: Self-pay | Admitting: Obstetrics & Gynecology

## 2018-07-16 ENCOUNTER — Other Ambulatory Visit: Payer: Self-pay

## 2018-07-16 ENCOUNTER — Encounter

## 2018-07-16 ENCOUNTER — Ambulatory Visit (INDEPENDENT_AMBULATORY_CARE_PROVIDER_SITE_OTHER): Payer: Medicare Other | Admitting: Obstetrics & Gynecology

## 2018-07-16 ENCOUNTER — Ambulatory Visit: Payer: Medicare Other | Admitting: Obstetrics & Gynecology

## 2018-07-16 VITALS — BP 122/80 | HR 76 | Temp 97.1°F | Ht 63.5 in | Wt 149.0 lb

## 2018-07-16 DIAGNOSIS — Z01419 Encounter for gynecological examination (general) (routine) without abnormal findings: Secondary | ICD-10-CM

## 2018-07-16 NOTE — Progress Notes (Signed)
78 y.o. [place] Married White or Caucasian female here for annual exam.  Doing well.  Denies vaginal bleeding.    Had trigger finger in her right hand.  Had injection in the finger.  Ended up with cellulitis in her arm as a result.  She had a second injection and the same thing happened a second time.  Has trigger finer in her index on her dominant hand.  Has been reluctant to have surgery due to the issues with cellulitis after the steroid injections.  Had MMG in 12/2017 that was negative.  Had some pain in the left axilla and had diagnostic study done have the mammogram per her hx.  I do have a diagnostic imaging study from 08/2017 but nothing since.    Stopped antibiotics with Dr. Matilde Sprang last year.  Has not had a UTI since stopping the antibiotics.    PCP:  Dr. Felipa Eth.  Had appt within the last two months. Did blood work at that visit.  Reports blood work was "all fine"    Patient's last menstrual period was 02/07/1989.          Sexually active: No.  The current method of family planning is post menopausal status.    Exercising: Yes.    walk Smoker:  no  Health Maintenance: Pap:  05/04/17 Neg   10/06/14 neg  History of abnormal Pap:  no MMG:  12/13/17 BIRADS2:benign   Colonoscopy:  2009 normal.  Dr. Felipa Eth has not recommended repeating a colonoscopy.   BMD:   12/12/16 osteopenia  TDaP:  2014 Pneumonia vaccine(s):  Done  Shingrix:   Completed  Hep C testing: n/a Screening Labs: pcp    reports that she has never smoked. She has never used smokeless tobacco. She reports that she does not drink alcohol or use drugs.  Past Medical History:  Diagnosis Date  . Breast cancer ([place]) 2003   right-lumpectomy  . Cellulitis of left arm    right arm not left   . Femoral hernia 1992  . GERD (gastroesophageal reflux disease)   . Glaucoma    low pressure  . Lymphedema of arm 04/2006   Right arm, Quit arimidex 07/2007  . PONV (postoperative nausea and vomiting)    primarily nausea      Past Surgical History:  Procedure Laterality Date  . AXILLARY LYMPH NODE DISSECTION Right 10/18/2001  . BREAST LUMPECTOMY  10/2001   lumpectomy, sentinel node bx, arimidex  . BUNIONECTOMY  1980  . CATARACT EXTRACTION, BILATERAL  2019  . DILATATION & CURETTAGE/HYSTEROSCOPY WITH MYOSURE N/A 10/27/2014   Procedure: DILATATION & CURETTAGE/HYSTEROSCOPY WITH RESECTOCOPE AND MYOSURE ;  Surgeon: Megan Salon, MD;  Location: [facility];  Service: Gynecology;  Laterality: N/A;  . FEMORAL HERNIA REPAIR  1992  . MASS EXCISION Right 12/14/2017   Procedure: EXCISION MASS RIGHT ANNULAR LIGAMENT;  Surgeon: Leanora Cover, MD;  Location: [facility];  Service: Orthopedics;  Laterality: Right;  . TRIGGER FINGER RELEASE Right 12/14/2017   Procedure: RELEASE TRIGGER FINGER/A-1 PULLEY RIGHT LONG FINGER;  Surgeon: Leanora Cover, MD;  Location: [facility];  Service: Orthopedics;  Laterality: Right;    Current Outpatient Medications  Medication Sig Dispense Refill  . atorvastatin (LIPITOR) 10 MG tablet Take 10 mg by mouth daily.     . Cholecalciferol (VITAMIN D) 2000 UNITS CAPS Take 2,000 Units by mouth daily.    . Coenzyme Q10 (CO Q 10 PO) Take 1 capsule by mouth daily. 100mg     .  Multiple Vitamins-Minerals (PRESERVISION AREDS 2 PO) Take by mouth daily.    . timolol (BETIMOL) 0.5 % ophthalmic solution Place 1 drop into both eyes 2 times daily.    . travoprost, benzalkonium, (TRAVATAN) 0.004 % ophthalmic solution Place 1 drop into both eyes nightly.     No current facility-administered medications for this visit.     Family History  Problem Relation Age of Onset  . Hypertension Mother   . Osteoporosis Mother   . Heart failure Mother   . Asthma Brother   . Emphysema Brother   . Cancer - Prostate Brother   . Diabetes Mellitus II Sister     Review of Systems  Genitourinary:       Breast cyst   All other systems reviewed and are negative.   Exam:   BP 122/80   Pulse 76    Temp (!) 97.1 F (36.2 C) (Temporal)   Ht 5' 3.5" (1.613 m)   Wt 149 lb (67.6 kg)   LMP 02/07/1989   BMI 25.98 kg/m    Height: 5' 3.5" (161.3 cm)  Ht Readings from Last 3 Encounters:  07/16/18 5' 3.5" (1.613 m)  12/14/17 5\' 4"  (1.626 m)  08/30/17 5\' 4"  (1.626 m)    General appearance: alert, cooperative and appears stated age Head: Normocephalic, without obvious abnormality, atraumatic Neck: no adenopathy, supple, symmetrical, trachea midline and thyroid normal to inspection and palpation Lungs: clear to auscultation bilaterally Breasts: well healed scar in right breast, no LAD or skin changes, stable radiation changes; left breast without masses, skin changes, LAD Heart: regular rate and rhythm Abdomen: soft, non-tender; bowel sounds normal; no masses,  no organomegaly Extremities: extremities normal, atraumatic, no cyanosis or edema Skin: Skin color, texture, turgor normal. No rashes or lesions Lymph nodes: Cervical, supraclavicular, and axillary nodes normal. No abnormal inguinal nodes palpated Neurologic: Grossly normal  Pelvic: External genitalia:  no lesions              Urethra:  normal appearing urethra with no masses, tenderness or lesions              Bartholins and Skenes: normal                 Vagina: normal appearing vagina with normal color and discharge, no lesions              Cervix: no lesions              Pap taken: No. Bimanual Exam:  Uterus:  normal size, contour, position, consistency, mobility, non-tender              Adnexa: normal adnexa and no mass, fullness, tenderness               Rectovaginal: Confirms               Anus:  normal sphincter tone, no lesions  Chaperone was present for exam.  A:  Well Woman with normal exam PMP, no HRT Left breast cysts noted on ultrasound H/O breast cancer 9/03, s/p lumpectomy and SNB with chemo and radiation, then Armidex for five years Chronic right arm lymphedema Elevated lipids S/p hysterectomy with polyp  resection 9/16 Possible TIA with negaetive evaluation with neurology H/o recurrent UTIs  P:   Mammogram guidelines reviewed.  Continues yearly. pap smear neg 2019.  Not indicated today. Lab work and vaccines are UTD Return annually or prn

## 2018-07-16 NOTE — Patient Instructions (Signed)
Dr. Roseanne Kaufman [address] [phone]

## 2018-12-31 ENCOUNTER — Encounter: Payer: Self-pay | Admitting: Obstetrics & Gynecology

## 2018-12-31 ENCOUNTER — Telehealth: Payer: Self-pay

## 2018-12-31 NOTE — Telephone Encounter (Signed)
Patient has been notified of bone density results as written by provider.

## 2019-04-05 ENCOUNTER — Ambulatory Visit: Payer: Medicare PPO | Admitting: Podiatry

## 2019-04-05 ENCOUNTER — Ambulatory Visit (INDEPENDENT_AMBULATORY_CARE_PROVIDER_SITE_OTHER): Payer: Medicare PPO

## 2019-04-05 ENCOUNTER — Encounter: Payer: Self-pay | Admitting: Podiatry

## 2019-04-05 ENCOUNTER — Other Ambulatory Visit: Payer: Self-pay

## 2019-04-05 DIAGNOSIS — M722 Plantar fascial fibromatosis: Secondary | ICD-10-CM

## 2019-04-05 DIAGNOSIS — M7671 Peroneal tendinitis, right leg: Secondary | ICD-10-CM | POA: Diagnosis not present

## 2019-04-05 NOTE — Patient Instructions (Signed)

## 2019-04-10 NOTE — Progress Notes (Signed)
Subjective:   Patient ID: Nehemiah Massed, female   DOB: 79 y.o.   MRN: [id number]   HPI Patient presents stating the right heel is hurting and the outside of the right foot has improved but it is also tender and is concerned about both these problems   ROS      Objective:  Physical Exam  Neurovascular status intact with quite a bit of inflammation plantar heel right at the insertion tendon calcaneus and moderate discomfort of the peroneal insertion fifth metatarsal right with inflammation noted around the tendinous insertion but no indication of muscle damage       Assessment:  Acute fasciitis right with inflammation along with peroneal tendinitis right which may be compensatory     Plan:  H&P reviewed both conditions and not focusing on the plantar heel and I injected the fascia 3 mg Kenalog 5 mg liken after sterile prep applied sterile dressing and for the right lateral foot ice therapy will be initiated and dispensed fascial brace with instructions on usage  X-rays indicate that there is no signs of fracture with mild spur formation arthritis

## 2019-04-19 ENCOUNTER — Ambulatory Visit: Payer: Medicare PPO | Admitting: Podiatry

## 2019-04-22 ENCOUNTER — Other Ambulatory Visit: Payer: Self-pay

## 2019-04-22 ENCOUNTER — Ambulatory Visit: Payer: Medicare PPO | Admitting: Podiatrist

## 2019-04-22 ENCOUNTER — Encounter: Payer: Self-pay | Admitting: Podiatrist

## 2019-04-22 VITALS — Temp 97.3°F

## 2019-04-22 DIAGNOSIS — M7671 Peroneal tendinitis, right leg: Secondary | ICD-10-CM

## 2019-04-22 NOTE — Patient Instructions (Signed)
Let us know if the discomfort becomes more bothersome- we can try physical therapy or pain creams.  Continue wearing the brace for 2-3 weeks.

## 2019-04-23 ENCOUNTER — Encounter: Payer: Self-pay | Admitting: Podiatrist

## 2019-04-23 NOTE — Progress Notes (Signed)
Chief Complaint  Patient presents with  . Pantar Fasciitis & Peroneal Tendonitis    Follow-up; Right foot; pt stated, "My foot feels 99% better; injection helped; brace is helping; does still have some discomfort on the bottom of the heel"     HPI: Patient is 79 y.o. female who presents today for follow up of peroneal tendon pain right foot.  She states the injection helped significantly and she has stopped wearing her brace for the last day or so.  She also relates some discomfort left foot forefoot regions    Allergies  Allergen Reactions  . Tape Dermatitis    Paper tape ok Paper tape ok  . Codeine Nausea And Vomiting  . Meloxicam Rash  . Nsaids Rash  . Sulfa Antibiotics Rash  . Sulfasalazine Rash  . Tolmetin Rash  . Trimethoprim Rash    Review of systems is reviewed and negative.   Physical Exam  Patient is awake, alert, and oriented x 3.  In no acute distress.    Vascular status is intact with palpable pedal pulses DP and PT bilateral and capillary refill time less than 3 seconds bilateral.  No edema or erythema noted.  Neurological exam reveals epicritic and protective sensation grossly intact bilateral.  Dermatological exam reveals skin is supple and dry to bilateral feet.  No open lesions present.   Musculoskeletal exam:  Mild pain on palpation along the peroneal tendon at its insertion on the fifth met base and first met base on the right foot.  Very mild diffuse discomfort on left foot at the metatarsal head region 2-4  Assessment: Peroneal tendonitis right with compensation left foot  Plan: Recommended continued wearing of the brace for another 2-3 weeks on the right and also suggested she step down to an elastic compression brace that is less bulky vs. Going without a brace.   Also discussed shoe gear possiblity of physical therapy in the future should her problem recur.  She will call if she has increase in discomfort.

## 2019-07-17 DIAGNOSIS — H401223 Low-tension glaucoma, left eye, severe stage: Secondary | ICD-10-CM | POA: Diagnosis not present

## 2019-08-02 ENCOUNTER — Telehealth: Payer: Self-pay | Admitting: Podiatry

## 2019-08-02 NOTE — Telephone Encounter (Signed)
Pt was seen in office for PF on 04/22/19 and given a brace for her foot. Pt has lost the brace and wanted to know if she would be able to come in for a new brace without coming in for another appt.   Please give patient a call.

## 2019-08-16 ENCOUNTER — Ambulatory Visit: Payer: Medicare PPO | Admitting: Podiatry

## 2019-08-20 NOTE — Progress Notes (Signed)
79 y.o. G0P0000 Married White or Caucasian female here for annual exam.  Doing well.  Last year was lonely.  She has been vaccinated.    Denies vaginal bleeding.      Pt reports she's got some white skin changes around the right nipple.   She hopes to go to [facility] for her 79th birthday.  Her grandson is doing his residency in ob/gyn and living in the city.     PCP:  Dr. Felipa Eth.  Saw him 03/2019.  Did blood work then.    Patient's last menstrual period was 02/07/1989.          Sexually active: No.  The current method of family planning is post menopausal status.    Exercising: No.  walking Smoker:  no  Health Maintenance: Pap:  05-04-17 neg History of abnormal Pap:  no MMG:  12-19-2018 category c density birads 2:neg Colonoscopy:  2009 normal-- no follow up per patient  BMD:   12-19-2018 TDaP:  2014 Pneumonia vaccine(s):  2018 Shingrix:   11-09-16 Hep C testing: no Screening Labs: PCP   reports that she has never smoked. She has never used smokeless tobacco. She reports that she does not drink alcohol and does not use drugs.  Past Medical History:  Diagnosis Date   Breast cancer ([place]) 2003   right-lumpectomy   Cellulitis of left arm    right arm not left    Femoral hernia 1992   GERD (gastroesophageal reflux disease)    Glaucoma    low pressure   Lymphedema of arm 04/2006   Right arm, Quit arimidex 07/2007   PONV (postoperative nausea and vomiting)    primarily nausea     Past Surgical History:  Procedure Laterality Date   AXILLARY LYMPH NODE DISSECTION Right 10/18/2001   BREAST LUMPECTOMY  10/2001   lumpectomy, sentinel node bx, arimidex   BUNIONECTOMY  1980   CATARACT EXTRACTION, BILATERAL  2019   DILATATION & CURETTAGE/HYSTEROSCOPY WITH MYOSURE N/A 10/27/2014   Procedure: [place] ;  Surgeon: Megan Salon, MD;  Location: [facility];  Service: Gynecology;  Laterality: N/A;   FEMORAL HERNIA REPAIR  1992    MASS EXCISION Right 12/14/2017   Procedure: EXCISION MASS RIGHT ANNULAR LIGAMENT;  Surgeon: Leanora Cover, MD;  Location: [facility];  Service: Orthopedics;  Laterality: Right;   TRIGGER FINGER RELEASE Right 12/14/2017   Procedure: RELEASE TRIGGER FINGER/A-1 PULLEY RIGHT LONG FINGER;  Surgeon: Leanora Cover, MD;  Location: [facility];  Service: Orthopedics;  Laterality: Right;    Current Outpatient Medications  Medication Sig Dispense Refill   atorvastatin (LIPITOR) 10 MG tablet Take 10 mg by mouth daily.      CALCIUM PO Take 650 mg by mouth.     Cholecalciferol (VITAMIN D) 2000 UNITS CAPS Take 2,000 Units by mouth daily.     Coenzyme Q10 (CO Q 10 PO) Take 1 capsule by mouth daily. 100mg      Multiple Vitamins-Minerals (PRESERVISION AREDS 2 PO) Take by mouth daily.     timolol (BETIMOL) 0.5 % ophthalmic solution Place 1 drop into both eyes 2 times daily.     travoprost, benzalkonium, (TRAVATAN) 0.004 % ophthalmic solution Place 1 drop into both eyes nightly.     No current facility-administered medications for this visit.    Family History  Problem Relation Age of Onset   Hypertension Mother    Osteoporosis Mother    Heart failure Mother  Asthma Brother    Emphysema Brother    Cancer - Prostate Brother    Diabetes Mellitus II Sister     Review of Systems  Genitourinary:       White area around right nipple  All other systems reviewed and are negative.   Exam:   BP (!) 150/76 (BP Location: Right Arm, Patient Position: Sitting, Cuff Size: Normal)    Pulse 72    Resp 14    Ht 5' 3.5" (1.613 m)    Wt 152 lb 6.4 oz (69.1 kg)    LMP 02/07/1989    BMI 26.57 kg/m   Height: 5' 3.5" (161.3 cm)  General appearance: alert, cooperative and appears stated age Head: Normocephalic, without obvious abnormality, atraumatic Neck: no adenopathy, supple, symmetrical, trachea midline and thyroid normal to inspection and palpation Lungs: clear to  auscultation bilaterally Breasts: normal appearance, no masses or tenderness Heart: regular rate and rhythm Abdomen: soft, non-tender; bowel sounds normal; no masses,  no organomegaly Extremities: extremities normal, atraumatic, no cyanosis or edema Skin: Skin color, texture, turgor normal. No rashes or lesions Lymph nodes: Cervical, supraclavicular, and axillary nodes normal. No abnormal inguinal nodes palpated Neurologic: Grossly normal   Pelvic: External genitalia:  no lesions              Urethra:  normal appearing urethra with no masses, tenderness or lesions              Bartholins and Skenes: normal                 Vagina: normal appearing vagina with normal color and discharge, no lesions              Cervix: no lesions              Pap taken: No. Bimanual Exam:  Uterus:  normal size, contour, position, consistency, mobility, non-tender              Adnexa: normal adnexa and no mass, fullness, tenderness               Rectovaginal: Confirms               Anus:  normal sphincter tone, no lesions  Chaperone, Royal Hawthorn, CMA, was present for exam.  A:  Well Woman with normal exam PMP, no HRT H/o breast cancer 9/03, s/p lumpectomy and SNB with chemo and radiation, then Arimidex for five years Chronic right arm lymphedema Elevated lipids, on medication S/p hysteroscopy with polyp resection 9/16 H/o possible TIA with negative evaluation with neurology H/o recurrent UTIs, no issues in last two years  P:   Mammogram guidelines reviewed.  Continues yearly. pap smear not indicated Colonoscopy not indicated due to age BMD done 2020.  Plan repeat 2-3 years Dr. Felipa Eth does blood work Return annually or prn

## 2019-08-23 ENCOUNTER — Encounter: Payer: Self-pay | Admitting: Obstetrics & Gynecology

## 2019-08-23 ENCOUNTER — Ambulatory Visit (INDEPENDENT_AMBULATORY_CARE_PROVIDER_SITE_OTHER): Payer: Medicare PPO | Admitting: Obstetrics & Gynecology

## 2019-08-23 ENCOUNTER — Other Ambulatory Visit: Payer: Self-pay

## 2019-08-23 VITALS — BP 138/76 | HR 72 | Resp 14 | Ht 63.5 in | Wt 152.4 lb

## 2019-08-23 DIAGNOSIS — Z01419 Encounter for gynecological examination (general) (routine) without abnormal findings: Secondary | ICD-10-CM | POA: Diagnosis not present

## 2019-08-23 MED ORDER — NYSTATIN 100000 UNIT/GM EX CREA: 1.0000 "application " | TOPICAL_CREAM | Freq: Two times a day (BID) | CUTANEOUS | 0 refills | Status: DC

## 2019-09-16 ENCOUNTER — Encounter: Payer: Self-pay | Admitting: Obstetrics and Gynecology

## 2019-09-16 ENCOUNTER — Other Ambulatory Visit: Payer: Self-pay

## 2019-09-16 ENCOUNTER — Telehealth: Payer: Self-pay

## 2019-09-16 ENCOUNTER — Ambulatory Visit: Payer: Medicare PPO | Admitting: Obstetrics and Gynecology

## 2019-09-16 VITALS — BP 122/68 | HR 80 | Ht 63.5 in | Wt 153.0 lb

## 2019-09-16 DIAGNOSIS — N3090 Cystitis, unspecified without hematuria: Secondary | ICD-10-CM

## 2019-09-16 LAB — POCT URINALYSIS DIPSTICK
Bilirubin, UA: NEGATIVE
Glucose, UA: NEGATIVE
Ketones, UA: NEGATIVE
Protein, UA: NEGATIVE
Urobilinogen, UA: NEGATIVE E.U./dL — AB
pH, UA: 7 (ref 5.0–8.0)

## 2019-09-16 MED ORDER — NITROFURANTOIN MONOHYD MACRO 100 MG PO CAPS: 100.0000 mg | ORAL_CAPSULE | Freq: Two times a day (BID) | ORAL | 0 refills | Status: DC

## 2019-09-16 NOTE — Patient Instructions (Signed)

## 2019-09-16 NOTE — Telephone Encounter (Signed)
Megan Graham is calling in regards to UTI. Megan Graham would like to see Dr. Quincy Simmonds since Dr. Sabra Heck is not in office. Need triage to assist.

## 2019-09-16 NOTE — Progress Notes (Signed)
GYNECOLOGY  VISIT   HPI: 79 y.o.   Married White or Caucasian Not Hispanic or Latino  female   G0P0000 with Patient's last menstrual period was 02/07/1989.   here for frequency and urgency or urination. She states that she has dysuria. Her symptoms started early Saturday.      She previously was seen by Urology for recurrent UTI's. She was treated with daily antibiotic for a year which helped. This weekend she was out shopping last weekend and had minimal fluid intake.  She is having urinary frequency and urgency x 2 days, dysuria at the end of voiding. No fevers. She has some back pain, thinks its MS. Not sexually active.   Her Yolanda Bonine is a first year OB/GYN resident at [employer], she hopes to visit him this fall. Other Grandson is in Sports coach school, one Granddaughter is in [place], the other [place] up at [employer]. Married x 47 years.   GYNECOLOGIC HISTORY: Patient's last menstrual period was 02/07/1989. Contraception:NA Menopausal hormone therapy: none        OB History    Gravida  0   Para  0   Term  0   Preterm  0   AB  0   Living  0     SAB  0   TAB  0   Ectopic  0   Multiple  0   Live Births                 Patient Active Problem List   Diagnosis Date Noted  . GERD (gastroesophageal reflux disease) 01/12/2017  . Hypercholesterolemia 01/12/2017  . Osteopenia 01/12/2017  . Glaucoma 10/06/2014  . History of breast cancer in female 11/26/2012    Past Medical History:  Diagnosis Date  . Breast cancer ([place]) 2003   right-lumpectomy  . Cellulitis of left arm    right arm not left   . Femoral hernia 1992  . GERD (gastroesophageal reflux disease)   . Glaucoma    low pressure  . Lymphedema of arm 04/2006   Right arm, Quit arimidex 07/2007  . PONV (postoperative nausea and vomiting)    primarily nausea     Past Surgical History:  Procedure Laterality Date  . AXILLARY LYMPH NODE DISSECTION Right 10/18/2001  . BREAST LUMPECTOMY  10/2001   lumpectomy,  sentinel node bx, arimidex  . BUNIONECTOMY  1980  . CATARACT EXTRACTION, BILATERAL  2019  . DILATATION & CURETTAGE/HYSTEROSCOPY WITH MYOSURE N/A 10/27/2014   Procedure: DILATATION & CURETTAGE/HYSTEROSCOPY WITH RESECTOCOPE AND MYOSURE ;  Surgeon: Megan Salon, MD;  Location: [facility];  Service: Gynecology;  Laterality: N/A;  . FEMORAL HERNIA REPAIR  1992  . MASS EXCISION Right 12/14/2017   Procedure: EXCISION MASS RIGHT ANNULAR LIGAMENT;  Surgeon: Leanora Cover, MD;  Location: [place];  Service: Orthopedics;  Laterality: Right;  . TRIGGER FINGER RELEASE Right 12/14/2017   Procedure: RELEASE TRIGGER FINGER/A-1 PULLEY RIGHT LONG FINGER;  Surgeon: Leanora Cover, MD;  Location: [facility];  Service: Orthopedics;  Laterality: Right;    Current Outpatient Medications  Medication Sig Dispense Refill  . atorvastatin (LIPITOR) 10 MG tablet Take 10 mg by mouth daily.     Marland Kitchen CALCIUM PO Take 650 mg by mouth.    . Cholecalciferol (VITAMIN D) 2000 UNITS CAPS Take 2,000 Units by mouth daily.    . Coenzyme Q10 (CO Q 10 PO) Take 1 capsule by mouth daily. 100mg     . Multiple Vitamins-Minerals (PRESERVISION AREDS  2 PO) Take by mouth daily.    Marland Kitchen nystatin cream (MYCOSTATIN) Apply 1 application topically 2 (two) times daily. Apply to affected area BID for 7-10 days 30 g 0  . timolol (BETIMOL) 0.5 % ophthalmic solution Place 1 drop into both eyes 2 times daily.    . travoprost, benzalkonium, (TRAVATAN) 0.004 % ophthalmic solution Place 1 drop into both eyes nightly.     No current facility-administered medications for this visit.     ALLERGIES: Tape, Codeine, Meloxicam, Nsaids, Sulfa antibiotics, Sulfasalazine, Tolmetin, and Trimethoprim  Family History  Problem Relation Age of Onset  . Hypertension Mother   . Osteoporosis Mother   . Heart failure Mother   . Asthma Brother   . Emphysema Brother   . Cancer - Prostate Brother   . Diabetes Mellitus II Sister     Social History    Socioeconomic History  . Marital status: Married    Spouse name: Natale Milch  . Number of children: 0  . Years of education: 8  . Highest education level: Not on file  Occupational History    Comment: retired Pharmacist, hospital  Tobacco Use  . Smoking status: Never Smoker  . Smokeless tobacco: Never Used  Vaping Use  . Vaping Use: Never used  Substance and Sexual Activity  . Alcohol use: No  . Drug use: No  . Sexual activity: Not Currently    Partners: Male    Birth control/protection: Post-menopausal  Other Topics Concern  . Not on file  Social History Narrative   Lives with husband   Caffeine - limited   Social Determinants of Health   Financial Resource Strain:   . Difficulty of Paying Living Expenses:   Food Insecurity:   . Worried About Charity fundraiser in the Last Year:   . Arboriculturist in the Last Year:   Transportation Needs:   . Film/video editor (Medical):   Marland Kitchen Lack of Transportation (Non-Medical):   Physical Activity:   . Days of Exercise per Week:   . Minutes of Exercise per Session:   Stress:   . Feeling of Stress :   Social Connections:   . Frequency of Communication with Friends and Family:   . Frequency of Social Gatherings with Friends and Family:   . Attends Religious Services:   . Active Member of Clubs or Organizations:   . Attends Archivist Meetings:   Marland Kitchen Marital Status:   Intimate Partner Violence:   . Fear of Current or Ex-Partner:   . Emotionally Abused:   Marland Kitchen Physically Abused:   . Sexually Abused:     Review of Systems  Genitourinary: Positive for dysuria, frequency, hematuria and urgency.    PHYSICAL EXAMINATION:    BP 122/68   Pulse 80   Ht 5' 3.5" (1.613 m)   Wt 153 lb (69.4 kg)   LMP 02/07/1989   SpO2 99%   BMI 26.68 kg/m     General appearance: alert, cooperative and appears stated age CVA: not tender Abdomen: soft, non-tender; non distended, no masses,  no organomegaly   ASSESSMENT Cystitis     PLAN Macrobid (has done well on this medication, no kidney disease) She has Azo to take as needed Hydrate well Call with fever, flank pain or any other concerns.   An After Visit Summary was printed and given to the patient.

## 2019-09-16 NOTE — Telephone Encounter (Signed)
Spoke with patient. Patient reports urinary frequency, urgency and dysuria that started on 8/7. Denies any other symptoms. Requesting OV with covering provider.   Last AEX w/ Dr. Sabra Heck 08/23/19  OV scheduled for today at 9:30am with Dr. Talbert Nan.   Patient verbalizes understanding and is agreeable.   Encounter closed.

## 2019-09-17 LAB — URINALYSIS, MICROSCOPIC ONLY
Bacteria, UA: NONE SEEN
Casts: NONE SEEN /lpf
Epithelial Cells (non renal): NONE SEEN /hpf (ref 0–10)
WBC, UA: >30 /hpf — AB (ref 0–5)

## 2019-09-18 LAB — URINE CULTURE

## 2019-09-19 ENCOUNTER — Other Ambulatory Visit: Payer: Self-pay

## 2019-09-19 ENCOUNTER — Ambulatory Visit: Payer: Medicare PPO | Admitting: Podiatry

## 2019-09-19 ENCOUNTER — Encounter: Payer: Self-pay | Admitting: Podiatry

## 2019-09-19 DIAGNOSIS — M722 Plantar fascial fibromatosis: Secondary | ICD-10-CM

## 2019-09-23 ENCOUNTER — Ambulatory Visit: Payer: Medicare PPO | Admitting: Obstetrics & Gynecology

## 2019-09-23 ENCOUNTER — Telehealth: Payer: Self-pay

## 2019-09-23 ENCOUNTER — Encounter: Payer: Self-pay | Admitting: Obstetrics & Gynecology

## 2019-09-23 ENCOUNTER — Other Ambulatory Visit: Payer: Self-pay

## 2019-09-23 VITALS — BP 138/80 | HR 80 | Resp 12 | Ht 63.5 in | Wt 155.0 lb

## 2019-09-23 DIAGNOSIS — N3090 Cystitis, unspecified without hematuria: Secondary | ICD-10-CM | POA: Diagnosis not present

## 2019-09-23 DIAGNOSIS — R309 Painful micturition, unspecified: Secondary | ICD-10-CM

## 2019-09-23 DIAGNOSIS — R3129 Other microscopic hematuria: Secondary | ICD-10-CM

## 2019-09-23 DIAGNOSIS — N898 Other specified noninflammatory disorders of vagina: Secondary | ICD-10-CM | POA: Diagnosis not present

## 2019-09-23 LAB — POCT URINALYSIS DIPSTICK
Bilirubin, UA: NEGATIVE
Glucose, UA: NEGATIVE
Ketones, UA: NEGATIVE
Nitrite, UA: NEGATIVE
Protein, UA: NEGATIVE
Urobilinogen, UA: 0.2 E.U./dL
pH, UA: 5 (ref 5.0–8.0)

## 2019-09-23 MED ORDER — CEPHALEXIN 250 MG PO CAPS: 250.0000 mg | ORAL_CAPSULE | Freq: Four times a day (QID) | ORAL | 0 refills | Status: DC

## 2019-09-23 NOTE — Progress Notes (Signed)
GYNECOLOGY  VISIT  CC:   Patient states that she started abx (Macrobid) for a UTI 09/16/19 and ended Friday, 09/20/19, but symptoms still persist. Per patient, has pain with urination and back pain. Patient also complains of having vaginal itching.  HPI: 79 y.o. G0P0000 Married White or Caucasian female here for uti symptoms including increased urinary frequency and urgency.  She was treated on 09/16/2019 with macrobid.  Culture showed e coli that was sensitive to all antibiotics.  Pt reports her symptoms started right back on Saturday less than 24 hours after stopping the antibiotics.  Denies hematuria or fever.  She is having a little lower back pain.  She is also having dysuria as well.  This is new.  Denies vaginal bleeding.    Urine: 3+ RBC, 1+ WBC, cloudy  GYNECOLOGIC HISTORY: Patient's last menstrual period was 02/07/1989. Contraception: Postmenopausal Menopausal hormone therapy: none  Patient Active Problem List   Diagnosis Date Noted  . GERD (gastroesophageal reflux disease) 01/12/2017  . Hypercholesterolemia 01/12/2017  . Osteopenia 01/12/2017  . Glaucoma 10/06/2014  . History of breast cancer in female 11/26/2012    Past Medical History:  Diagnosis Date  . Breast cancer ([place]) 2003   right-lumpectomy  . Cellulitis of left arm    right arm not left   . Femoral hernia 1992  . GERD (gastroesophageal reflux disease)   . Glaucoma    low pressure  . Lymphedema of arm 04/2006   Right arm, Quit arimidex 07/2007  . PONV (postoperative nausea and vomiting)    primarily nausea     Past Surgical History:  Procedure Laterality Date  . AXILLARY LYMPH NODE DISSECTION Right 10/18/2001  . BREAST LUMPECTOMY  10/2001   lumpectomy, sentinel node bx, arimidex  . BUNIONECTOMY  1980  . CATARACT EXTRACTION, BILATERAL  2019  . DILATATION & CURETTAGE/HYSTEROSCOPY WITH MYOSURE N/A 10/27/2014   Procedure: DILATATION & CURETTAGE/HYSTEROSCOPY WITH RESECTOCOPE AND MYOSURE ;  Surgeon: Megan Salon,  MD;  Location: [facility];  Service: Gynecology;  Laterality: N/A;  . FEMORAL HERNIA REPAIR  1992  . MASS EXCISION Right 12/14/2017   Procedure: EXCISION MASS RIGHT ANNULAR LIGAMENT;  Surgeon: Leanora Cover, MD;  Location: [facility];  Service: Orthopedics;  Laterality: Right;  . TRIGGER FINGER RELEASE Right 12/14/2017   Procedure: RELEASE TRIGGER FINGER/A-1 PULLEY RIGHT LONG FINGER;  Surgeon: Leanora Cover, MD;  Location: [facility];  Service: Orthopedics;  Laterality: Right;    MEDS:   Current Outpatient Medications on File Prior to Visit  Medication Sig Dispense Refill  . atorvastatin (LIPITOR) 10 MG tablet Take 10 mg by mouth daily.     Marland Kitchen CALCIUM PO Take 650 mg by mouth.    . Cholecalciferol (VITAMIN D) 2000 UNITS CAPS Take 2,000 Units by mouth daily.    . Coenzyme Q10 (CO Q 10 PO) Take 1 capsule by mouth daily. 100mg     . Multiple Vitamins-Minerals (PRESERVISION AREDS 2 PO) Take by mouth daily.    Marland Kitchen nystatin cream (MYCOSTATIN) Apply 1 application topically 2 (two) times daily. Apply to affected area BID for 7-10 days 30 g 0  . timolol (BETIMOL) 0.5 % ophthalmic solution Place 1 drop into both eyes 2 times daily.    . travoprost, benzalkonium, (TRAVATAN) 0.004 % ophthalmic solution Place 1 drop into both eyes nightly.     No current facility-administered medications on file prior to visit.    ALLERGIES: Tape, Codeine, Meloxicam, Nsaids, Sulfa antibiotics, Sulfasalazine, Tolmetin, and Trimethoprim  Family History  Problem Relation Age of Onset  . Hypertension Mother   . Osteoporosis Mother   . Heart failure Mother   . Asthma Brother   . Emphysema Brother   . Cancer - Prostate Brother   . Diabetes Mellitus II Sister     SH:  Married, non smoker  Review of Systems  Genitourinary:       Pain with urination Vaginal itching  Musculoskeletal: Positive for back pain.  All other systems reviewed and are negative.   PHYSICAL EXAMINATION:    BP 138/80  (BP Location: Left Arm, Patient Position: Sitting, Cuff Size: Normal)   Pulse 80   Resp 12   Ht 5' 3.5" (1.613 m)   Wt 155 lb (70.3 kg)   LMP 02/07/1989   BMI 27.03 kg/m     General appearance: alert, cooperative and appears stated age Lymph:  no inguinal LAD noted  Pelvic: External genitalia:  no lesions              Urethra:  normal appearing urethra with no masses, tenderness or lesions              Bartholins and Skenes: normal                 Vagina: normal appearing vagina with normal color and discharge, no lesions              Cervix: no lesions              Bimanual Exam:  Uterus:  normal size, contour, position, consistency, mobility, non-tender              Adnexa: no mass, fullness, tenderness  Chaperone, Olene Floss, CMA, was present for exam.  Assessment: Cystitis with e coli positive urine 09/16/2019 with recurrent symptoms after being off antibiotic for <24 hours Vulvar itching  Plan: Urine culture pending Rx for Keflex 250mg  qid x 5 days Affirm pending

## 2019-09-23 NOTE — Telephone Encounter (Signed)
OV-8/9/21with JJ, +UC for E-Coli,treated with Macrobid Rx x 5 days.   Spoke with pt. Pt states still having urinary sx after finishing Macrobid Rx on 09/20/19. Pt states has increased water intake, but still having pain at end of urination and lower back pain. Pt denies fever, chills, NVD, abd pain/cramps.  Pt advised to have OV for recheck. Pt agreeable. Pt scheduled as work-in appt today with Dr Sabra Heck at 115 pm. Pt agreeable and verbalized understanding of date and time of appt.  Encounter closed.

## 2019-09-23 NOTE — Telephone Encounter (Signed)
Patient is calling in regards to finishing antibiotics for UTI. Patient states she would like to be seen due to feeling as if the UTI is not gone.

## 2019-09-23 NOTE — Addendum Note (Signed)
Addended by: Megan Salon on: 09/23/2019 02:11 PM   Modules accepted: Orders

## 2019-09-24 LAB — VAGINITIS/VAGINOSIS, DNA PROBE
Candida Species: NEGATIVE
Gardnerella vaginalis: NEGATIVE
Trichomonas vaginosis: NEGATIVE

## 2019-09-24 LAB — URINALYSIS, MICROSCOPIC ONLY
Bacteria, UA: NONE SEEN
Casts: NONE SEEN /lpf
RBC: NONE SEEN /hpf (ref 0–2)
WBC, UA: >30 /hpf — AB (ref 0–5)

## 2019-09-24 NOTE — Progress Notes (Signed)
Subjective:   Patient ID: Megan Graham, female   DOB: 79 y.o.   MRN: [id number]   HPI Patient presents stating she is developed a lot of pain in the bottom of her right heel and does not remember specific injury.  States it is been sore and she feels like she does not have enough support   ROS      Objective:  Physical Exam  Neurovascular status intact with patient found to have inflammation pain of the right plantar heel with fluid buildup around the medial band as it inserts into the calcaneus     Assessment:  Acute plantar fasciitis right with inflammation fluid at the insertional point tendon calcaneus     Plan:  H&P reviewed condition and x-ray today I did sterile prep and injected the fascia at insertion 3 mg dexamethasone Kenalog 5 mg Xylocaine and applied a night splint with all instructions on usage at the current time.  We discussed other possible treatments in future including complete immobilization or possible surgical intervention or shockwave but hopefully will respond to conservative treatment

## 2019-09-25 LAB — URINE CULTURE

## 2019-09-25 NOTE — Addendum Note (Signed)
Addended by: Megan Salon on: 09/25/2019 03:30 PM   Modules accepted: Orders

## 2019-09-28 DIAGNOSIS — Z20828 Contact with and (suspected) exposure to other viral communicable diseases: Secondary | ICD-10-CM | POA: Diagnosis not present

## 2019-10-08 ENCOUNTER — Other Ambulatory Visit: Payer: Self-pay

## 2019-10-08 ENCOUNTER — Ambulatory Visit (INDEPENDENT_AMBULATORY_CARE_PROVIDER_SITE_OTHER): Payer: Medicare PPO

## 2019-10-08 DIAGNOSIS — N3090 Cystitis, unspecified without hematuria: Secondary | ICD-10-CM | POA: Diagnosis not present

## 2019-10-08 NOTE — Progress Notes (Signed)
Patient in office for a test of cure urine culture. Patient states that she is doing well and has completed the course of antibiotics. Per patient, has no complaints. Clean-catch urine sample collected and sent to the lab to be resulted. Patient verbalizes understanding and is agreeable. Routing to provider for final review and closing encounter.

## 2019-10-10 LAB — URINE CULTURE

## 2019-10-15 ENCOUNTER — Telehealth: Payer: Self-pay

## 2019-10-15 MED ORDER — AMOXICILLIN-POT CLAVULANATE 875-125 MG PO TABS: 1.0000 | ORAL_TABLET | Freq: Two times a day (BID) | ORAL | 0 refills | Status: AC

## 2019-10-15 NOTE — Telephone Encounter (Signed)
Spoke with pt. Pt given results and recommendations per Dr Sabra Heck. Pt verbalized understanding and agreeable to take new Rx. Pt states has seen Dr Matilde Sprang 2 years ago.   Pt declines referral at this time. Pt wants to wait on next urine culture. Pt scheduled for nurse visit for repeat urine culture on 9/20 at 945 am. Pt agreeable to date and time of appt.   Routing to Dr Sabra Heck for update  Rx sent to pharmacy on file.  Encounter closed

## 2019-10-15 NOTE — Telephone Encounter (Signed)
-----   Message from Megan Salon, MD sent at 10/15/2019  1:13 PM EDT ----- Please call pt.  Her urine culture is still positive for e coli.  She's taken macrobid and keflex and it is still present.  She is allergic to trimethoprim and sulfa.  Ok to send in rx for augmentin 875mg  bid x 5 days.  I would repeat the culture in 10-14 days.  She needs urology referral to Dr. Claudia Desanctis please.  Thanks.

## 2019-10-28 ENCOUNTER — Other Ambulatory Visit: Payer: Self-pay

## 2019-10-28 ENCOUNTER — Ambulatory Visit: Payer: Medicare PPO

## 2019-10-28 VITALS — BP 140/68 | HR 70 | Resp 12 | Ht 63.0 in | Wt 154.0 lb

## 2019-10-28 DIAGNOSIS — R8279 Other abnormal findings on microbiological examination of urine: Secondary | ICD-10-CM

## 2019-10-28 DIAGNOSIS — R309 Painful micturition, unspecified: Secondary | ICD-10-CM

## 2019-10-28 DIAGNOSIS — N390 Urinary tract infection, site not specified: Secondary | ICD-10-CM

## 2019-10-28 NOTE — Addendum Note (Signed)
Addended by: York Cerise C on: 10/28/2019 10:05 AM   Modules accepted: Orders

## 2019-10-28 NOTE — Progress Notes (Signed)
Urine culture sent to lab. Pt said that she is having no symptoms at this time.   York Cerise, CMA

## 2019-10-30 NOTE — Addendum Note (Signed)
Addended by: Megan Salon on: 10/30/2019 10:32 AM   Modules accepted: Orders

## 2019-10-31 LAB — URINE CULTURE

## 2019-11-07 ENCOUNTER — Telehealth: Payer: Self-pay

## 2019-11-07 DIAGNOSIS — Z23 Encounter for immunization: Secondary | ICD-10-CM | POA: Diagnosis not present

## 2019-11-07 MED ORDER — NITROFURANTOIN MONOHYD MACRO 100 MG PO CAPS: 100.0000 mg | ORAL_CAPSULE | Freq: Two times a day (BID) | ORAL | 0 refills | Status: AC

## 2019-11-07 NOTE — Telephone Encounter (Signed)
Spoke with patient. Patient has been tx for E-coli UTI on 8/9, 8/31 and 9/20. She has been tx with Macrobid, keflex and Augmentin. She is scheduled with urology on 10/5, sates she was advised this is the first available appt.   Patient reports dysuria that started on 9/29. She started increasing fluid intake. Patient reports she has a family member under hospice care that is expected to pass today and she has family coming into town for a concert over the weekend. Patient is requesting an abx "to get through the upcoming days". Advised patient I will review request with Dr. Sabra Heck and f/u with recommendations. Patient agreeable.   Dr. Sabra Heck -please review and advise.

## 2019-11-07 NOTE — Telephone Encounter (Signed)
Patient is calling in regards to needing an antibiotic called in to get her through until her appointment at [facility]. Patient states it is critical to have this before the weekend due to "personal emergencies".

## 2019-11-07 NOTE — Telephone Encounter (Signed)
Ok to restart macrobid 100mg  bid x 5 days.

## 2019-11-07 NOTE — Telephone Encounter (Signed)
Spoke with patient, advised per Dr. Sabra Heck. Rx to verified pharmacy. Patient verbalizes understanding and is agreeable.   Encounter closed.

## 2019-11-12 DIAGNOSIS — R350 Frequency of micturition: Secondary | ICD-10-CM | POA: Diagnosis not present

## 2019-11-12 DIAGNOSIS — R351 Nocturia: Secondary | ICD-10-CM | POA: Diagnosis not present

## 2019-12-02 DIAGNOSIS — H401212 Low-tension glaucoma, right eye, moderate stage: Secondary | ICD-10-CM | POA: Diagnosis not present

## 2019-12-02 DIAGNOSIS — H5213 Myopia, bilateral: Secondary | ICD-10-CM | POA: Diagnosis not present

## 2019-12-02 DIAGNOSIS — H401223 Low-tension glaucoma, left eye, severe stage: Secondary | ICD-10-CM | POA: Diagnosis not present

## 2019-12-02 DIAGNOSIS — H04123 Dry eye syndrome of bilateral lacrimal glands: Secondary | ICD-10-CM | POA: Diagnosis not present

## 2019-12-23 ENCOUNTER — Encounter: Payer: Self-pay | Admitting: Obstetrics & Gynecology

## 2019-12-23 DIAGNOSIS — Z1231 Encounter for screening mammogram for malignant neoplasm of breast: Secondary | ICD-10-CM | POA: Diagnosis not present

## 2019-12-31 DIAGNOSIS — J0100 Acute maxillary sinusitis, unspecified: Secondary | ICD-10-CM | POA: Diagnosis not present

## 2020-01-10 DIAGNOSIS — R351 Nocturia: Secondary | ICD-10-CM | POA: Diagnosis not present

## 2020-01-10 DIAGNOSIS — N390 Urinary tract infection, site not specified: Secondary | ICD-10-CM | POA: Diagnosis not present

## 2020-04-06 DIAGNOSIS — L570 Actinic keratosis: Secondary | ICD-10-CM | POA: Diagnosis not present

## 2020-04-06 DIAGNOSIS — D1801 Hemangioma of skin and subcutaneous tissue: Secondary | ICD-10-CM | POA: Diagnosis not present

## 2020-04-06 DIAGNOSIS — L820 Inflamed seborrheic keratosis: Secondary | ICD-10-CM | POA: Diagnosis not present

## 2020-04-06 DIAGNOSIS — L814 Other melanin hyperpigmentation: Secondary | ICD-10-CM | POA: Diagnosis not present

## 2020-04-20 DIAGNOSIS — Z961 Presence of intraocular lens: Secondary | ICD-10-CM | POA: Diagnosis not present

## 2020-04-20 DIAGNOSIS — H26493 Other secondary cataract, bilateral: Secondary | ICD-10-CM | POA: Diagnosis not present

## 2020-04-20 DIAGNOSIS — H04123 Dry eye syndrome of bilateral lacrimal glands: Secondary | ICD-10-CM | POA: Diagnosis not present

## 2020-04-20 DIAGNOSIS — H401223 Low-tension glaucoma, left eye, severe stage: Secondary | ICD-10-CM | POA: Diagnosis not present

## 2020-05-11 ENCOUNTER — Other Ambulatory Visit: Payer: Self-pay | Admitting: Geriatric Medicine

## 2020-05-11 ENCOUNTER — Ambulatory Visit
Admission: RE | Admit: 2020-05-11 | Discharge: 2020-05-11 | Disposition: A | Discharge status: Home / Self Care | Payer: Medicare PPO | Source: Ambulatory Visit | Attending: Geriatric Medicine | Admitting: Geriatric Medicine

## 2020-05-11 DIAGNOSIS — Z79899 Other long term (current) drug therapy: Secondary | ICD-10-CM | POA: Diagnosis not present

## 2020-05-11 DIAGNOSIS — R0781 Pleurodynia: Secondary | ICD-10-CM

## 2020-05-11 DIAGNOSIS — Z1389 Encounter for screening for other disorder: Secondary | ICD-10-CM | POA: Diagnosis not present

## 2020-05-11 DIAGNOSIS — E7800 Pure hypercholesterolemia, unspecified: Secondary | ICD-10-CM | POA: Diagnosis not present

## 2020-05-11 DIAGNOSIS — R300 Dysuria: Secondary | ICD-10-CM | POA: Diagnosis not present

## 2020-05-11 DIAGNOSIS — J301 Allergic rhinitis due to pollen: Secondary | ICD-10-CM | POA: Diagnosis not present

## 2020-05-11 DIAGNOSIS — Z0000 Encounter for general adult medical examination without abnormal findings: Secondary | ICD-10-CM | POA: Diagnosis not present

## 2020-05-11 DIAGNOSIS — M8580 Other specified disorders of bone density and structure, unspecified site: Secondary | ICD-10-CM | POA: Diagnosis not present

## 2020-05-11 DIAGNOSIS — K219 Gastro-esophageal reflux disease without esophagitis: Secondary | ICD-10-CM | POA: Diagnosis not present

## 2020-05-15 DIAGNOSIS — N3000 Acute cystitis without hematuria: Secondary | ICD-10-CM | POA: Diagnosis not present

## 2020-05-15 DIAGNOSIS — L03113 Cellulitis of right upper limb: Secondary | ICD-10-CM | POA: Diagnosis not present

## 2020-06-10 DIAGNOSIS — L03113 Cellulitis of right upper limb: Secondary | ICD-10-CM | POA: Diagnosis not present

## 2020-06-15 DIAGNOSIS — L03113 Cellulitis of right upper limb: Secondary | ICD-10-CM | POA: Diagnosis not present

## 2020-08-24 DIAGNOSIS — H401223 Low-tension glaucoma, left eye, severe stage: Secondary | ICD-10-CM | POA: Diagnosis not present

## 2020-09-16 ENCOUNTER — Ambulatory Visit (INDEPENDENT_AMBULATORY_CARE_PROVIDER_SITE_OTHER): Payer: Medicare PPO | Admitting: Obstetrics & Gynecology

## 2020-09-16 ENCOUNTER — Other Ambulatory Visit: Payer: Self-pay

## 2020-09-16 ENCOUNTER — Encounter (HOSPITAL_BASED_OUTPATIENT_CLINIC_OR_DEPARTMENT_OTHER): Payer: Self-pay | Admitting: Obstetrics & Gynecology

## 2020-09-16 VITALS — BP 157/78 | HR 71 | Ht 63.5 in | Wt 150.8 lb

## 2020-09-16 DIAGNOSIS — Z01419 Encounter for gynecological examination (general) (routine) without abnormal findings: Secondary | ICD-10-CM | POA: Diagnosis not present

## 2020-09-16 DIAGNOSIS — I890 Lymphedema, not elsewhere classified: Secondary | ICD-10-CM | POA: Diagnosis not present

## 2020-09-16 DIAGNOSIS — N390 Urinary tract infection, site not specified: Secondary | ICD-10-CM | POA: Insufficient documentation

## 2020-09-16 DIAGNOSIS — Z853 Personal history of malignant neoplasm of breast: Secondary | ICD-10-CM

## 2020-09-16 DIAGNOSIS — Z780 Asymptomatic menopausal state: Secondary | ICD-10-CM | POA: Diagnosis not present

## 2020-09-16 DIAGNOSIS — M85852 Other specified disorders of bone density and structure, left thigh: Secondary | ICD-10-CM

## 2020-09-16 NOTE — Progress Notes (Signed)
80 y.o. G0P0000 Married White or Caucasian female here for breast and pelvic exam.  She has hx of breast cancer in 2003.  Was on Arimidex for five years.  Doe shave chronic right arm lymphedema.  She wears a sleeve.    Denies vaginal bleeding.  Had hx of endometrial polyp resection 10/2014.   Did have a UTI this year.  Is on suppressive therapy with macrobid '100mg'$ .  Does have follow up with Dr. Matilde Sprang.     Feels her left side of the abdomen is more prominent.  She would like to know why this occurs.  Denies pain.  No bowel changes.  No blood in stool.  Notes and test results since I saw pt reviewed in Epic.  Pt has chest x ray done in 05/2020 due to some chest wall pain.  This was negative.  Patient's last menstrual period was 02/07/1989.          Sexually active: No.  H/O STD:  no  Health Maintenance: PCP:  Dr. Felipa Eth.  Last wellness appt was 05/2020.  Did blood work at that appt:  yes Vaccines are up to date:  yes Colonoscopy:  2009 normal.  No screening follow up recommended.   MMG:  12/2019 BMD:  12/2018 Last pap smear:  2019.   H/o abnormal pap smear:  no   reports that she has never smoked. She has never used smokeless tobacco. She reports that she does not drink alcohol and does not use drugs.  Past Medical History:  Diagnosis Date   Breast cancer ([place]) 2003   right-lumpectomy   Cellulitis of left arm    right arm not left    Femoral hernia 1992   GERD (gastroesophageal reflux disease)    Glaucoma    low pressure   Lymphedema of arm 04/2006   Right arm, Quit arimidex 07/2007   PONV (postoperative nausea and vomiting)    primarily nausea     Past Surgical History:  Procedure Laterality Date   AXILLARY LYMPH NODE DISSECTION Right 10/18/2001   BREAST LUMPECTOMY  10/2001   lumpectomy, sentinel node bx, arimidex   BUNIONECTOMY  1980   CATARACT EXTRACTION, BILATERAL  2019   DILATATION & CURETTAGE/HYSTEROSCOPY WITH MYOSURE N/A 10/27/2014   Procedure: [place] ;  Surgeon: Megan Salon, MD;  Location: [facility];  Service: Gynecology;  Laterality: N/A;   FEMORAL HERNIA REPAIR  1992   MASS EXCISION Right 12/14/2017   Procedure: EXCISION MASS RIGHT ANNULAR LIGAMENT;  Surgeon: Leanora Cover, MD;  Location: [facility];  Service: Orthopedics;  Laterality: Right;   TRIGGER FINGER RELEASE Right 12/14/2017   Procedure: RELEASE TRIGGER FINGER/A-1 PULLEY RIGHT LONG FINGER;  Surgeon: Leanora Cover, MD;  Location: [place];  Service: Orthopedics;  Laterality: Right;    Current Outpatient Medications  Medication Sig Dispense Refill   atorvastatin (LIPITOR) 10 MG tablet Take 10 mg by mouth daily.      CALCIUM PO Take 650 mg by mouth.     Cholecalciferol (VITAMIN D) 2000 UNITS CAPS Take 2,000 Units by mouth daily.     Coenzyme Q10 (CO Q 10 PO) Take 1 capsule by mouth daily. '100mg'$      Multiple Vitamins-Minerals (PRESERVISION AREDS 2 PO) Take by mouth daily.     timolol (BETIMOL) 0.5 % ophthalmic solution Place 1 drop into both eyes 2 times daily.     travoprost, benzalkonium, (TRAVATAN) 0.004 % ophthalmic solution Place 1  drop into both eyes nightly.     nystatin cream (MYCOSTATIN) Apply 1 application topically 2 (two) times daily. Apply to affected area BID for 7-10 days 30 g 0   No current facility-administered medications for this visit.    Family History  Problem Relation Age of Onset   Hypertension Mother    Osteoporosis Mother    Heart failure Mother    Asthma Brother    Emphysema Brother    Cancer - Prostate Brother    Diabetes Mellitus II Sister     Review of Systems  Constitutional: Negative.   Gastrointestinal: Negative.   Genitourinary: Negative.    Exam:   BP (!) 157/78 (BP Location: Left Arm, Patient Position: Sitting, Cuff Size: Small)   Pulse 71   Ht 5' 3.5" (1.613 m)   Wt 150 lb 12.8 oz (68.4 kg)   LMP 02/07/1989   BMI 26.29 kg/m   Height: 5' 3.5"  (161.3 cm)  General appearance: alert, cooperative and appears stated age Breasts: normal appearance, no masses or tenderness, well healed scars on left breast/axilla Abdomen: soft, non-tender; bowel sounds normal; no masses,  no organomegaly Lymph nodes: Cervical, supraclavicular, and axillary nodes normal.  No abnormal inguinal nodes palpated Neurologic: Grossly normal  Pelvic: External genitalia:  no lesions              Urethra:  normal appearing urethra with no masses, tenderness or lesions              Bartholins and Skenes: normal                 Vagina: normal appearing vagina with atrophic changes and no discharge, no lesions              Cervix: no lesions              Pap taken: No. Bimanual Exam:  Uterus:  normal size, contour, position, consistency, mobility, non-tender              Adnexa: normal adnexa and no mass, fullness, tenderness               Rectovaginal: Confirms               Anus:  normal sphincter tone, no lesions  Chaperone, Ezekiel Ina, RN, was present for exam.  Assessment/Plan: 1. Encntr for gyn exam (general) (routine) w/o abn findings - pap smear not indicated - MMG done 12/2019 - BMD order will [facility] to [facility] - colonoscopy not needed - lab work with Dr. Felipa Eth and vaccines up to date  2. Postmenopausal - no HRT  3. History of breast cancer in female  4. Lymphedema  5. Recurrent UTI - on macrobid  6. Osteopenia of left hip - see above  7.  Elevated BP today - pt will monitor at home and call Dr. Felipa Eth for follow up if needed.

## 2020-10-06 DIAGNOSIS — C50911 Malignant neoplasm of unspecified site of right female breast: Secondary | ICD-10-CM | POA: Diagnosis not present

## 2020-10-07 DIAGNOSIS — C50911 Malignant neoplasm of unspecified site of right female breast: Secondary | ICD-10-CM | POA: Diagnosis not present

## 2020-11-05 DIAGNOSIS — Z23 Encounter for immunization: Secondary | ICD-10-CM | POA: Diagnosis not present

## 2020-11-05 DIAGNOSIS — L03113 Cellulitis of right upper limb: Secondary | ICD-10-CM | POA: Diagnosis not present

## 2020-11-24 DIAGNOSIS — H01115 Allergic dermatitis of left lower eyelid: Secondary | ICD-10-CM | POA: Diagnosis not present

## 2020-11-24 DIAGNOSIS — H01114 Allergic dermatitis of left upper eyelid: Secondary | ICD-10-CM | POA: Diagnosis not present

## 2021-01-02 DIAGNOSIS — H00015 Hordeolum externum left lower eyelid: Secondary | ICD-10-CM | POA: Diagnosis not present

## 2021-01-02 DIAGNOSIS — L03213 Periorbital cellulitis: Secondary | ICD-10-CM | POA: Diagnosis not present

## 2021-01-06 DIAGNOSIS — R350 Frequency of micturition: Secondary | ICD-10-CM | POA: Diagnosis not present

## 2021-01-06 DIAGNOSIS — R351 Nocturia: Secondary | ICD-10-CM | POA: Diagnosis not present

## 2021-01-14 DIAGNOSIS — H5213 Myopia, bilateral: Secondary | ICD-10-CM | POA: Diagnosis not present

## 2021-01-14 DIAGNOSIS — H401223 Low-tension glaucoma, left eye, severe stage: Secondary | ICD-10-CM | POA: Diagnosis not present

## 2021-01-14 DIAGNOSIS — D3131 Benign neoplasm of right choroid: Secondary | ICD-10-CM | POA: Diagnosis not present

## 2021-01-15 DIAGNOSIS — M8589 Other specified disorders of bone density and structure, multiple sites: Secondary | ICD-10-CM | POA: Diagnosis not present

## 2021-01-15 DIAGNOSIS — Z1231 Encounter for screening mammogram for malignant neoplasm of breast: Secondary | ICD-10-CM | POA: Diagnosis not present

## 2021-01-15 DIAGNOSIS — Z780 Asymptomatic menopausal state: Secondary | ICD-10-CM | POA: Diagnosis not present

## 2021-01-20 ENCOUNTER — Encounter (HOSPITAL_BASED_OUTPATIENT_CLINIC_OR_DEPARTMENT_OTHER): Payer: Self-pay | Admitting: *Deleted

## 2021-04-12 DIAGNOSIS — L84 Corns and callosities: Secondary | ICD-10-CM | POA: Diagnosis not present

## 2021-04-12 DIAGNOSIS — L820 Inflamed seborrheic keratosis: Secondary | ICD-10-CM | POA: Diagnosis not present

## 2021-04-12 DIAGNOSIS — L570 Actinic keratosis: Secondary | ICD-10-CM | POA: Diagnosis not present

## 2021-04-12 DIAGNOSIS — D692 Other nonthrombocytopenic purpura: Secondary | ICD-10-CM | POA: Diagnosis not present

## 2021-04-12 DIAGNOSIS — L821 Other seborrheic keratosis: Secondary | ICD-10-CM | POA: Diagnosis not present

## 2021-04-12 DIAGNOSIS — D2372 Other benign neoplasm of skin of left lower limb, including hip: Secondary | ICD-10-CM | POA: Diagnosis not present

## 2021-04-12 DIAGNOSIS — L814 Other melanin hyperpigmentation: Secondary | ICD-10-CM | POA: Diagnosis not present

## 2021-04-13 DIAGNOSIS — J069 Acute upper respiratory infection, unspecified: Secondary | ICD-10-CM | POA: Diagnosis not present

## 2021-04-13 DIAGNOSIS — I872 Venous insufficiency (chronic) (peripheral): Secondary | ICD-10-CM | POA: Diagnosis not present

## 2021-05-25 DIAGNOSIS — K219 Gastro-esophageal reflux disease without esophagitis: Secondary | ICD-10-CM | POA: Diagnosis not present

## 2021-05-25 DIAGNOSIS — R600 Localized edema: Secondary | ICD-10-CM | POA: Diagnosis not present

## 2021-05-25 DIAGNOSIS — Z0000 Encounter for general adult medical examination without abnormal findings: Secondary | ICD-10-CM | POA: Diagnosis not present

## 2021-05-25 DIAGNOSIS — Z1389 Encounter for screening for other disorder: Secondary | ICD-10-CM | POA: Diagnosis not present

## 2021-05-25 DIAGNOSIS — M79671 Pain in right foot: Secondary | ICD-10-CM | POA: Diagnosis not present

## 2021-05-25 DIAGNOSIS — J301 Allergic rhinitis due to pollen: Secondary | ICD-10-CM | POA: Diagnosis not present

## 2021-05-25 DIAGNOSIS — I890 Lymphedema, not elsewhere classified: Secondary | ICD-10-CM | POA: Diagnosis not present

## 2021-05-25 DIAGNOSIS — Z79899 Other long term (current) drug therapy: Secondary | ICD-10-CM | POA: Diagnosis not present

## 2021-05-25 DIAGNOSIS — E7800 Pure hypercholesterolemia, unspecified: Secondary | ICD-10-CM | POA: Diagnosis not present

## 2021-05-31 DIAGNOSIS — H401223 Low-tension glaucoma, left eye, severe stage: Secondary | ICD-10-CM | POA: Diagnosis not present

## 2021-09-06 ENCOUNTER — Ambulatory Visit
Admission: RE | Admit: 2021-09-06 | Discharge: 2021-09-06 | Disposition: A | Discharge status: Home / Self Care | Payer: Medicare PPO | Source: Ambulatory Visit | Attending: Internal Medicine | Admitting: Internal Medicine

## 2021-09-06 ENCOUNTER — Other Ambulatory Visit: Payer: Self-pay | Admitting: Internal Medicine

## 2021-09-06 DIAGNOSIS — M7731 Calcaneal spur, right foot: Secondary | ICD-10-CM | POA: Diagnosis not present

## 2021-09-06 DIAGNOSIS — G8929 Other chronic pain: Secondary | ICD-10-CM | POA: Diagnosis not present

## 2021-09-06 DIAGNOSIS — M25571 Pain in right ankle and joints of right foot: Secondary | ICD-10-CM | POA: Diagnosis not present

## 2021-09-20 ENCOUNTER — Other Ambulatory Visit: Payer: Self-pay | Admitting: Internal Medicine

## 2021-09-20 ENCOUNTER — Ambulatory Visit
Admission: RE | Admit: 2021-09-20 | Discharge: 2021-09-20 | Disposition: A | Discharge status: Home / Self Care | Payer: Medicare PPO | Source: Ambulatory Visit | Attending: Internal Medicine | Admitting: Internal Medicine

## 2021-09-20 DIAGNOSIS — M545 Low back pain, unspecified: Secondary | ICD-10-CM | POA: Diagnosis not present

## 2021-09-20 DIAGNOSIS — M5442 Lumbago with sciatica, left side: Secondary | ICD-10-CM

## 2021-09-20 DIAGNOSIS — M47816 Spondylosis without myelopathy or radiculopathy, lumbar region: Secondary | ICD-10-CM | POA: Diagnosis not present

## 2021-09-30 DIAGNOSIS — M5442 Lumbago with sciatica, left side: Secondary | ICD-10-CM | POA: Diagnosis not present

## 2021-09-30 DIAGNOSIS — R2689 Other abnormalities of gait and mobility: Secondary | ICD-10-CM | POA: Diagnosis not present

## 2021-10-05 DIAGNOSIS — H401131 Primary open-angle glaucoma, bilateral, mild stage: Secondary | ICD-10-CM | POA: Diagnosis not present

## 2021-10-06 ENCOUNTER — Ambulatory Visit (INDEPENDENT_AMBULATORY_CARE_PROVIDER_SITE_OTHER): Payer: Medicare PPO | Admitting: Obstetrics & Gynecology

## 2021-10-06 ENCOUNTER — Encounter (HOSPITAL_BASED_OUTPATIENT_CLINIC_OR_DEPARTMENT_OTHER): Payer: Self-pay | Admitting: Obstetrics & Gynecology

## 2021-10-06 VITALS — BP 148/72 | HR 79 | Ht 63.5 in | Wt 153.8 lb

## 2021-10-06 DIAGNOSIS — Z87440 Personal history of urinary (tract) infections: Secondary | ICD-10-CM | POA: Diagnosis not present

## 2021-10-06 DIAGNOSIS — Z01419 Encounter for gynecological examination (general) (routine) without abnormal findings: Secondary | ICD-10-CM

## 2021-10-06 DIAGNOSIS — Z780 Asymptomatic menopausal state: Secondary | ICD-10-CM

## 2021-10-06 DIAGNOSIS — Z853 Personal history of malignant neoplasm of breast: Secondary | ICD-10-CM

## 2021-10-06 DIAGNOSIS — M85852 Other specified disorders of bone density and structure, left thigh: Secondary | ICD-10-CM | POA: Diagnosis not present

## 2021-10-06 NOTE — Progress Notes (Signed)
81 y.o. [place] Married White or Caucasian female here for breast and pelvic exam.  I am also following her for history of breast cancer diagnosed in 2003.  Wears sleeve on right arm.  Off any adjuvant therapy.  Denies vaginal bleeding.  Having some issues with sciatica.  Started with PT at [facility] [place].  Was prescribed steroid dosing.  No UTIs this last year.  Will see Dr. Matilde Sprang in November.   Patient's last menstrual period was 02/07/1989.          Sexually active: No.  H/O STD:  no  Health Maintenance: PCP:  Dr. Felipa Eth.  Last wellness appt was 05/2021.  Did blood work at that appt:  yes Vaccines are up to date:  yes Colonoscopy:  2009.  No routine follow up recommended. MMG:  01/15/2021 Negative BMD:  01/15/2021 Mild Osteopenia Last pap smear:  05/04/2017 Negative.   H/o abnormal pap smear:      reports that she has never smoked. She has never used smokeless tobacco. She reports that she does not drink alcohol and does not use drugs.  Past Medical History:  Diagnosis Date   Breast cancer ([place]) 2003   right-lumpectomy   Cellulitis of left arm    right arm not left    Femoral hernia 1992   GERD (gastroesophageal reflux disease)    Glaucoma    low pressure   Lymphedema of arm 04/2006   Right arm, Quit arimidex 07/2007   PONV (postoperative nausea and vomiting)    primarily nausea     Past Surgical History:  Procedure Laterality Date   AXILLARY LYMPH NODE DISSECTION Right 10/18/2001   BREAST LUMPECTOMY  10/2001   lumpectomy, sentinel node bx, arimidex   BUNIONECTOMY  1980   CATARACT EXTRACTION, BILATERAL  2019   DILATATION & CURETTAGE/HYSTEROSCOPY WITH MYOSURE N/A 10/27/2014   Procedure: [place] ;  Surgeon: Megan Salon, MD;  Location: [facility];  Service: Gynecology;  Laterality: N/A;   FEMORAL HERNIA REPAIR  1992   MASS EXCISION Right 12/14/2017   Procedure: EXCISION MASS RIGHT ANNULAR LIGAMENT;  Surgeon:  Leanora Cover, MD;  Location: [place];  Service: Orthopedics;  Laterality: Right;   TRIGGER FINGER RELEASE Right 12/14/2017   Procedure: RELEASE TRIGGER FINGER/A-1 PULLEY RIGHT LONG FINGER;  Surgeon: Leanora Cover, MD;  Location: [place];  Service: Orthopedics;  Laterality: Right;    Current Outpatient Medications  Medication Sig Dispense Refill   atorvastatin (LIPITOR) 10 MG tablet Take 10 mg by mouth daily.      CALCIUM PO Take 650 mg by mouth.     Cholecalciferol (VITAMIN D) 2000 UNITS CAPS Take 2,000 Units by mouth daily.     Coenzyme Q10 (CO Q 10 PO) Take 1 capsule by mouth daily. '100mg'$      Multiple Vitamins-Minerals (PRESERVISION AREDS 2 PO) Take by mouth daily.     timolol (BETIMOL) 0.5 % ophthalmic solution Place 1 drop into both eyes 2 times daily.     travoprost, benzalkonium, (TRAVATAN) 0.004 % ophthalmic solution Place 1 drop into both eyes nightly.     No current facility-administered medications for this visit.    Family History  Problem Relation Age of Onset   Hypertension Mother    Osteoporosis Mother    Heart failure Mother    Asthma Brother    Emphysema Brother    Cancer - Prostate Brother    Diabetes Mellitus II Sister  Review of Systems  Constitutional: Negative.   Genitourinary: Negative.     Exam:   BP (!) 148/72 (BP Location: Right Arm, Patient Position: Sitting, Cuff Size: Large)   Pulse 79   Ht 5' 3.5" (1.613 m) Comment: Reported  Wt 153 lb 12.8 oz (69.8 kg)   LMP 02/07/1989   BMI 26.82 kg/m   Height: 5' 3.5" (161.3 cm) (Reported)  General appearance: alert, cooperative and appears stated age Breasts: normal appearance, no masses or tenderness Abdomen: soft, non-tender; bowel sounds normal; no masses,  no organomegaly Lymph nodes: Cervical, supraclavicular, and axillary nodes normal.  No abnormal inguinal nodes palpated Neurologic: Grossly normal  Pelvic: External genitalia:  no lesions               Urethra:  normal appearing urethra with no masses, tenderness or lesions              Bartholins and Skenes: normal                 Vagina: normal appearing vagina with atrophic changes and no discharge, no lesions              Cervix: no lesions              Pap taken: No. Bimanual Exam:  Uterus:  normal size, contour, position, consistency, mobility, non-tender              Adnexa: no mass, fullness, tenderness               Rectovaginal: Confirms               Anus:  normal sphincter tone, no lesions  Chaperone, Octaviano Batty, CMA, was present for exam.  Assessment/Plan: 1. Encntr for gyn exam (general) (routine) w/o abn findings - Pap smear last done 2019.  Guidelines reviewed. - Mammogram will be requested - Colonoscopy 2009.  No follow up recommended. - Bone mineral density 2022. - lab work done done with PCP - vaccines reviewed/updated  2. History of breast cancer in female - has been released by oncology  3. Postmenopausal  4. History of recurrent UTI (urinary tract infection) - on macrobid.  Followed by Dr. Matilde Sprang  5. Osteopenia of left hip - will repeat BMD next year

## 2021-10-07 ENCOUNTER — Encounter (HOSPITAL_BASED_OUTPATIENT_CLINIC_OR_DEPARTMENT_OTHER): Payer: Self-pay | Admitting: *Deleted

## 2021-10-08 DIAGNOSIS — M5442 Lumbago with sciatica, left side: Secondary | ICD-10-CM | POA: Diagnosis not present

## 2021-10-08 DIAGNOSIS — R2689 Other abnormalities of gait and mobility: Secondary | ICD-10-CM | POA: Diagnosis not present

## 2021-10-12 DIAGNOSIS — R2689 Other abnormalities of gait and mobility: Secondary | ICD-10-CM | POA: Diagnosis not present

## 2021-10-12 DIAGNOSIS — M5442 Lumbago with sciatica, left side: Secondary | ICD-10-CM | POA: Diagnosis not present

## 2021-10-15 DIAGNOSIS — R2689 Other abnormalities of gait and mobility: Secondary | ICD-10-CM | POA: Diagnosis not present

## 2021-10-15 DIAGNOSIS — M5442 Lumbago with sciatica, left side: Secondary | ICD-10-CM | POA: Diagnosis not present

## 2021-10-19 DIAGNOSIS — M5442 Lumbago with sciatica, left side: Secondary | ICD-10-CM | POA: Diagnosis not present

## 2021-10-19 DIAGNOSIS — R2689 Other abnormalities of gait and mobility: Secondary | ICD-10-CM | POA: Diagnosis not present

## 2021-10-22 DIAGNOSIS — R2689 Other abnormalities of gait and mobility: Secondary | ICD-10-CM | POA: Diagnosis not present

## 2021-10-22 DIAGNOSIS — M5442 Lumbago with sciatica, left side: Secondary | ICD-10-CM | POA: Diagnosis not present

## 2021-10-27 DIAGNOSIS — R2689 Other abnormalities of gait and mobility: Secondary | ICD-10-CM | POA: Diagnosis not present

## 2021-10-27 DIAGNOSIS — M5442 Lumbago with sciatica, left side: Secondary | ICD-10-CM | POA: Diagnosis not present

## 2021-10-29 DIAGNOSIS — M5442 Lumbago with sciatica, left side: Secondary | ICD-10-CM | POA: Diagnosis not present

## 2021-10-29 DIAGNOSIS — R2689 Other abnormalities of gait and mobility: Secondary | ICD-10-CM | POA: Diagnosis not present

## 2021-11-03 DIAGNOSIS — R2689 Other abnormalities of gait and mobility: Secondary | ICD-10-CM | POA: Diagnosis not present

## 2021-11-03 DIAGNOSIS — M5442 Lumbago with sciatica, left side: Secondary | ICD-10-CM | POA: Diagnosis not present

## 2021-11-05 DIAGNOSIS — M5442 Lumbago with sciatica, left side: Secondary | ICD-10-CM | POA: Diagnosis not present

## 2021-11-05 DIAGNOSIS — R2689 Other abnormalities of gait and mobility: Secondary | ICD-10-CM | POA: Diagnosis not present

## 2021-11-12 DIAGNOSIS — R2689 Other abnormalities of gait and mobility: Secondary | ICD-10-CM | POA: Diagnosis not present

## 2021-11-12 DIAGNOSIS — M5442 Lumbago with sciatica, left side: Secondary | ICD-10-CM | POA: Diagnosis not present

## 2021-11-15 DIAGNOSIS — R2689 Other abnormalities of gait and mobility: Secondary | ICD-10-CM | POA: Diagnosis not present

## 2021-11-15 DIAGNOSIS — M5442 Lumbago with sciatica, left side: Secondary | ICD-10-CM | POA: Diagnosis not present

## 2021-11-19 DIAGNOSIS — M5442 Lumbago with sciatica, left side: Secondary | ICD-10-CM | POA: Diagnosis not present

## 2021-11-19 DIAGNOSIS — R2689 Other abnormalities of gait and mobility: Secondary | ICD-10-CM | POA: Diagnosis not present

## 2021-11-20 DIAGNOSIS — Z23 Encounter for immunization: Secondary | ICD-10-CM | POA: Diagnosis not present

## 2021-11-22 DIAGNOSIS — M5442 Lumbago with sciatica, left side: Secondary | ICD-10-CM | POA: Diagnosis not present

## 2021-11-22 DIAGNOSIS — R2689 Other abnormalities of gait and mobility: Secondary | ICD-10-CM | POA: Diagnosis not present

## 2021-11-26 DIAGNOSIS — R2689 Other abnormalities of gait and mobility: Secondary | ICD-10-CM | POA: Diagnosis not present

## 2021-11-26 DIAGNOSIS — M5442 Lumbago with sciatica, left side: Secondary | ICD-10-CM | POA: Diagnosis not present

## 2022-01-05 DIAGNOSIS — R350 Frequency of micturition: Secondary | ICD-10-CM | POA: Diagnosis not present

## 2022-01-11 DIAGNOSIS — J069 Acute upper respiratory infection, unspecified: Secondary | ICD-10-CM | POA: Diagnosis not present

## 2022-01-11 DIAGNOSIS — R051 Acute cough: Secondary | ICD-10-CM | POA: Diagnosis not present

## 2022-01-11 DIAGNOSIS — Z03818 Encounter for observation for suspected exposure to other biological agents ruled out: Secondary | ICD-10-CM | POA: Diagnosis not present

## 2022-01-20 DIAGNOSIS — Z1231 Encounter for screening mammogram for malignant neoplasm of breast: Secondary | ICD-10-CM | POA: Diagnosis not present

## 2022-02-03 ENCOUNTER — Encounter (HOSPITAL_BASED_OUTPATIENT_CLINIC_OR_DEPARTMENT_OTHER): Payer: Self-pay | Admitting: *Deleted

## 2022-04-11 DIAGNOSIS — L821 Other seborrheic keratosis: Secondary | ICD-10-CM | POA: Diagnosis not present

## 2022-04-11 DIAGNOSIS — D2372 Other benign neoplasm of skin of left lower limb, including hip: Secondary | ICD-10-CM | POA: Diagnosis not present

## 2022-04-11 DIAGNOSIS — L814 Other melanin hyperpigmentation: Secondary | ICD-10-CM | POA: Diagnosis not present

## 2022-04-11 DIAGNOSIS — L570 Actinic keratosis: Secondary | ICD-10-CM | POA: Diagnosis not present

## 2022-04-11 DIAGNOSIS — D1801 Hemangioma of skin and subcutaneous tissue: Secondary | ICD-10-CM | POA: Diagnosis not present

## 2022-04-18 ENCOUNTER — Ambulatory Visit
Admission: RE | Admit: 2022-04-18 | Discharge: 2022-04-18 | Disposition: A | Discharge status: Home / Self Care | Payer: Medicare PPO | Source: Ambulatory Visit | Attending: Internal Medicine | Admitting: Internal Medicine

## 2022-04-18 ENCOUNTER — Other Ambulatory Visit: Payer: Self-pay | Admitting: Internal Medicine

## 2022-04-18 DIAGNOSIS — M25511 Pain in right shoulder: Secondary | ICD-10-CM

## 2022-04-18 DIAGNOSIS — M79605 Pain in left leg: Secondary | ICD-10-CM | POA: Diagnosis not present

## 2022-04-18 DIAGNOSIS — M79671 Pain in right foot: Secondary | ICD-10-CM | POA: Diagnosis not present

## 2022-05-03 DIAGNOSIS — M25511 Pain in right shoulder: Secondary | ICD-10-CM | POA: Diagnosis not present

## 2022-05-13 DIAGNOSIS — M25511 Pain in right shoulder: Secondary | ICD-10-CM | POA: Diagnosis not present

## 2022-05-13 DIAGNOSIS — M6281 Muscle weakness (generalized): Secondary | ICD-10-CM | POA: Diagnosis not present

## 2022-05-13 DIAGNOSIS — R278 Other lack of coordination: Secondary | ICD-10-CM | POA: Diagnosis not present

## 2022-05-18 DIAGNOSIS — M25511 Pain in right shoulder: Secondary | ICD-10-CM | POA: Diagnosis not present

## 2022-05-18 DIAGNOSIS — R278 Other lack of coordination: Secondary | ICD-10-CM | POA: Diagnosis not present

## 2022-05-18 DIAGNOSIS — M6281 Muscle weakness (generalized): Secondary | ICD-10-CM | POA: Diagnosis not present

## 2022-05-20 DIAGNOSIS — M6281 Muscle weakness (generalized): Secondary | ICD-10-CM | POA: Diagnosis not present

## 2022-05-20 DIAGNOSIS — M25511 Pain in right shoulder: Secondary | ICD-10-CM | POA: Diagnosis not present

## 2022-05-20 DIAGNOSIS — R278 Other lack of coordination: Secondary | ICD-10-CM | POA: Diagnosis not present

## 2022-05-25 DIAGNOSIS — M6281 Muscle weakness (generalized): Secondary | ICD-10-CM | POA: Diagnosis not present

## 2022-05-25 DIAGNOSIS — R278 Other lack of coordination: Secondary | ICD-10-CM | POA: Diagnosis not present

## 2022-05-25 DIAGNOSIS — M25511 Pain in right shoulder: Secondary | ICD-10-CM | POA: Diagnosis not present

## 2022-05-31 DIAGNOSIS — R278 Other lack of coordination: Secondary | ICD-10-CM | POA: Diagnosis not present

## 2022-05-31 DIAGNOSIS — M6281 Muscle weakness (generalized): Secondary | ICD-10-CM | POA: Diagnosis not present

## 2022-05-31 DIAGNOSIS — M25511 Pain in right shoulder: Secondary | ICD-10-CM | POA: Diagnosis not present

## 2022-06-01 DIAGNOSIS — R278 Other lack of coordination: Secondary | ICD-10-CM | POA: Diagnosis not present

## 2022-06-01 DIAGNOSIS — M6281 Muscle weakness (generalized): Secondary | ICD-10-CM | POA: Diagnosis not present

## 2022-06-01 DIAGNOSIS — M25511 Pain in right shoulder: Secondary | ICD-10-CM | POA: Diagnosis not present

## 2022-06-07 DIAGNOSIS — M25511 Pain in right shoulder: Secondary | ICD-10-CM | POA: Diagnosis not present

## 2022-06-07 DIAGNOSIS — M6281 Muscle weakness (generalized): Secondary | ICD-10-CM | POA: Diagnosis not present

## 2022-06-07 DIAGNOSIS — R278 Other lack of coordination: Secondary | ICD-10-CM | POA: Diagnosis not present

## 2022-06-09 DIAGNOSIS — R278 Other lack of coordination: Secondary | ICD-10-CM | POA: Diagnosis not present

## 2022-06-09 DIAGNOSIS — M25511 Pain in right shoulder: Secondary | ICD-10-CM | POA: Diagnosis not present

## 2022-06-09 DIAGNOSIS — M6281 Muscle weakness (generalized): Secondary | ICD-10-CM | POA: Diagnosis not present

## 2022-06-14 DIAGNOSIS — R278 Other lack of coordination: Secondary | ICD-10-CM | POA: Diagnosis not present

## 2022-06-14 DIAGNOSIS — M25511 Pain in right shoulder: Secondary | ICD-10-CM | POA: Diagnosis not present

## 2022-06-14 DIAGNOSIS — M6281 Muscle weakness (generalized): Secondary | ICD-10-CM | POA: Diagnosis not present

## 2022-06-16 DIAGNOSIS — R278 Other lack of coordination: Secondary | ICD-10-CM | POA: Diagnosis not present

## 2022-06-16 DIAGNOSIS — M6281 Muscle weakness (generalized): Secondary | ICD-10-CM | POA: Diagnosis not present

## 2022-06-16 DIAGNOSIS — M25511 Pain in right shoulder: Secondary | ICD-10-CM | POA: Diagnosis not present

## 2022-06-21 DIAGNOSIS — M25511 Pain in right shoulder: Secondary | ICD-10-CM | POA: Diagnosis not present

## 2022-06-21 DIAGNOSIS — R278 Other lack of coordination: Secondary | ICD-10-CM | POA: Diagnosis not present

## 2022-06-21 DIAGNOSIS — M6281 Muscle weakness (generalized): Secondary | ICD-10-CM | POA: Diagnosis not present

## 2022-06-23 DIAGNOSIS — M25511 Pain in right shoulder: Secondary | ICD-10-CM | POA: Diagnosis not present

## 2022-06-23 DIAGNOSIS — M6281 Muscle weakness (generalized): Secondary | ICD-10-CM | POA: Diagnosis not present

## 2022-06-23 DIAGNOSIS — R278 Other lack of coordination: Secondary | ICD-10-CM | POA: Diagnosis not present

## 2022-06-28 DIAGNOSIS — M6281 Muscle weakness (generalized): Secondary | ICD-10-CM | POA: Diagnosis not present

## 2022-06-28 DIAGNOSIS — R278 Other lack of coordination: Secondary | ICD-10-CM | POA: Diagnosis not present

## 2022-06-28 DIAGNOSIS — M25511 Pain in right shoulder: Secondary | ICD-10-CM | POA: Diagnosis not present

## 2022-06-30 DIAGNOSIS — M25511 Pain in right shoulder: Secondary | ICD-10-CM | POA: Diagnosis not present

## 2022-06-30 DIAGNOSIS — M6281 Muscle weakness (generalized): Secondary | ICD-10-CM | POA: Diagnosis not present

## 2022-06-30 DIAGNOSIS — R278 Other lack of coordination: Secondary | ICD-10-CM | POA: Diagnosis not present

## 2022-07-06 DIAGNOSIS — R278 Other lack of coordination: Secondary | ICD-10-CM | POA: Diagnosis not present

## 2022-07-06 DIAGNOSIS — M25511 Pain in right shoulder: Secondary | ICD-10-CM | POA: Diagnosis not present

## 2022-07-06 DIAGNOSIS — M6281 Muscle weakness (generalized): Secondary | ICD-10-CM | POA: Diagnosis not present

## 2022-07-07 DIAGNOSIS — M25511 Pain in right shoulder: Secondary | ICD-10-CM | POA: Diagnosis not present

## 2022-07-07 DIAGNOSIS — M6281 Muscle weakness (generalized): Secondary | ICD-10-CM | POA: Diagnosis not present

## 2022-07-07 DIAGNOSIS — R278 Other lack of coordination: Secondary | ICD-10-CM | POA: Diagnosis not present

## 2022-07-11 DIAGNOSIS — H26493 Other secondary cataract, bilateral: Secondary | ICD-10-CM | POA: Diagnosis not present

## 2022-07-11 DIAGNOSIS — H401131 Primary open-angle glaucoma, bilateral, mild stage: Secondary | ICD-10-CM | POA: Diagnosis not present

## 2022-07-12 DIAGNOSIS — M6281 Muscle weakness (generalized): Secondary | ICD-10-CM | POA: Diagnosis not present

## 2022-07-12 DIAGNOSIS — M25511 Pain in right shoulder: Secondary | ICD-10-CM | POA: Diagnosis not present

## 2022-07-12 DIAGNOSIS — R278 Other lack of coordination: Secondary | ICD-10-CM | POA: Diagnosis not present

## 2022-09-08 DIAGNOSIS — Z79899 Other long term (current) drug therapy: Secondary | ICD-10-CM | POA: Diagnosis not present

## 2022-09-08 DIAGNOSIS — I700 Atherosclerosis of aorta: Secondary | ICD-10-CM | POA: Diagnosis not present

## 2022-09-08 DIAGNOSIS — G8929 Other chronic pain: Secondary | ICD-10-CM | POA: Diagnosis not present

## 2022-09-08 DIAGNOSIS — I890 Lymphedema, not elsewhere classified: Secondary | ICD-10-CM | POA: Diagnosis not present

## 2022-09-08 DIAGNOSIS — J301 Allergic rhinitis due to pollen: Secondary | ICD-10-CM | POA: Diagnosis not present

## 2022-09-08 DIAGNOSIS — M25561 Pain in right knee: Secondary | ICD-10-CM | POA: Diagnosis not present

## 2022-09-08 DIAGNOSIS — Z23 Encounter for immunization: Secondary | ICD-10-CM | POA: Diagnosis not present

## 2022-09-08 DIAGNOSIS — Z0000 Encounter for general adult medical examination without abnormal findings: Secondary | ICD-10-CM | POA: Diagnosis not present

## 2022-09-08 DIAGNOSIS — M79671 Pain in right foot: Secondary | ICD-10-CM | POA: Diagnosis not present

## 2022-09-08 DIAGNOSIS — K219 Gastro-esophageal reflux disease without esophagitis: Secondary | ICD-10-CM | POA: Diagnosis not present

## 2022-09-08 DIAGNOSIS — E7800 Pure hypercholesterolemia, unspecified: Secondary | ICD-10-CM | POA: Diagnosis not present

## 2022-09-08 DIAGNOSIS — Z1331 Encounter for screening for depression: Secondary | ICD-10-CM | POA: Diagnosis not present

## 2022-09-20 DIAGNOSIS — M25561 Pain in right knee: Secondary | ICD-10-CM | POA: Diagnosis not present

## 2022-09-20 DIAGNOSIS — M25571 Pain in right ankle and joints of right foot: Secondary | ICD-10-CM | POA: Diagnosis not present

## 2022-09-20 DIAGNOSIS — M25572 Pain in left ankle and joints of left foot: Secondary | ICD-10-CM | POA: Diagnosis not present

## 2022-09-20 DIAGNOSIS — M79672 Pain in left foot: Secondary | ICD-10-CM | POA: Diagnosis not present

## 2022-09-20 DIAGNOSIS — M79671 Pain in right foot: Secondary | ICD-10-CM | POA: Diagnosis not present

## 2022-10-17 ENCOUNTER — Ambulatory Visit (HOSPITAL_BASED_OUTPATIENT_CLINIC_OR_DEPARTMENT_OTHER): Payer: Medicare PPO | Admitting: Obstetrics & Gynecology

## 2022-10-18 ENCOUNTER — Ambulatory Visit (HOSPITAL_BASED_OUTPATIENT_CLINIC_OR_DEPARTMENT_OTHER): Payer: Medicare PPO | Admitting: Obstetrics & Gynecology

## 2022-10-18 ENCOUNTER — Encounter (HOSPITAL_BASED_OUTPATIENT_CLINIC_OR_DEPARTMENT_OTHER): Payer: Self-pay | Admitting: Obstetrics & Gynecology

## 2022-10-18 VITALS — BP 130/82 | HR 71 | Ht 63.5 in | Wt 157.2 lb

## 2022-10-18 DIAGNOSIS — Z01419 Encounter for gynecological examination (general) (routine) without abnormal findings: Secondary | ICD-10-CM | POA: Diagnosis not present

## 2022-10-18 DIAGNOSIS — B372 Candidiasis of skin and nail: Secondary | ICD-10-CM

## 2022-10-18 DIAGNOSIS — N390 Urinary tract infection, site not specified: Secondary | ICD-10-CM

## 2022-10-18 DIAGNOSIS — I700 Atherosclerosis of aorta: Secondary | ICD-10-CM | POA: Insufficient documentation

## 2022-10-18 DIAGNOSIS — Z853 Personal history of malignant neoplasm of breast: Secondary | ICD-10-CM

## 2022-10-18 DIAGNOSIS — Z780 Asymptomatic menopausal state: Secondary | ICD-10-CM | POA: Diagnosis not present

## 2022-10-18 DIAGNOSIS — M85852 Other specified disorders of bone density and structure, left thigh: Secondary | ICD-10-CM

## 2022-10-18 MED ORDER — NYSTATIN 100000 UNIT/GM EX CREA: 1.0000 | TOPICAL_CREAM | Freq: Two times a day (BID) | CUTANEOUS | 0 refills | Status: AC

## 2022-10-18 MED ORDER — NITROFURANTOIN MACROCRYSTAL 100 MG PO CAPS
100.0000 mg | ORAL_CAPSULE | Freq: Two times a day (BID) | ORAL | Status: AC
Start: 2022-10-18 — End: ?

## 2022-10-18 NOTE — Progress Notes (Signed)
82 y.o. G0P0000 Married White or Caucasian female here for breast and pelvic exam.  I am also following her for history of breast cancer diagnosed in 2003.  She is now off adjuvant therapy.  She wears a sleeve on her right arm.  Denies vaginal bleeding.  Does occasionally have some LLQ tingling sensation.  It typically doesn't last long.  For example, had this sensation this morning and has already gone away.  No bowel changes.    H/o recurrent UTIs.  Has seen Dr. Sherron Monday and has appt this fall.    Patient's last menstrual period was 02/07/1989.          Sexually active: No.  H/O STD:  no  Health Maintenance: PCP:  Dr. Eleanora Neighbor, MD.  Last wellness appt was early August.  Did blood work at that appt:  yes Vaccines are up to date:  has not done influenza yet Colonoscopy:  2009.  No routine follow up recommended.   MMG:  01/20/2022 BMD:  01/15/2021 mild osteopenia Last pap smear:  05/04/17    reports that she has never smoked. She has never used smokeless tobacco. She reports that she does not drink alcohol and does not use drugs.  Past Medical History:  Diagnosis Date   Breast cancer ([facility]) 2003   right-lumpectomy   Cellulitis of left arm    right arm not left    Femoral hernia 1992   GERD (gastroesophageal reflux disease)    Glaucoma    low pressure   Lymphedema of arm 04/2006   Right arm, Quit arimidex 07/2007   PONV (postoperative nausea and vomiting)    primarily nausea     Past Surgical History:  Procedure Laterality Date   AXILLARY LYMPH NODE DISSECTION Right 10/18/2001   BREAST LUMPECTOMY  10/2001   lumpectomy, sentinel node bx, arimidex   BUNIONECTOMY  1980   CATARACT EXTRACTION, BILATERAL  2019   DILATATION & CURETTAGE/HYSTEROSCOPY WITH MYOSURE N/A 10/27/2014   Procedure: DILATATION & CURETTAGE/HYSTEROSCOPY WITH RESECTOCOPE AND MYOSURE ;  Surgeon: Jerene Bears, MD;  Location: [facility];  Service: Gynecology;  Laterality: N/A;   FEMORAL HERNIA REPAIR  1992    MASS EXCISION Right 12/14/2017   Procedure: EXCISION MASS RIGHT ANNULAR LIGAMENT;  Surgeon: Betha Loa, MD;  Location: [facility];  Service: Orthopedics;  Laterality: Right;   TRIGGER FINGER RELEASE Right 12/14/2017   Procedure: RELEASE TRIGGER FINGER/A-1 PULLEY RIGHT LONG FINGER;  Surgeon: Betha Loa, MD;  Location: [facility];  Service: Orthopedics;  Laterality: Right;    Current Outpatient Medications  Medication Sig Dispense Refill   atorvastatin (LIPITOR) 10 MG tablet Take 10 mg by mouth daily.      CALCIUM PO Take 650 mg by mouth.     Cholecalciferol (VITAMIN D) 2000 UNITS CAPS Take 2,000 Units by mouth daily.     Coenzyme Q10 (CO Q 10 PO) Take 1 capsule by mouth daily. 100mg      Multiple Vitamins-Minerals (PRESERVISION AREDS 2 PO) Take by mouth daily.     timolol (BETIMOL) 0.5 % ophthalmic solution Place 1 drop into both eyes 2 times daily.     travoprost, benzalkonium, (TRAVATAN) 0.004 % ophthalmic solution Place 1 drop into both eyes nightly.     No current facility-administered medications for this visit.    Family History  Problem Relation Age of Onset   Hypertension Mother    Osteoporosis Mother    Heart failure Mother    Asthma Brother  Emphysema Brother    Cancer - Prostate Brother    Diabetes Mellitus II Sister     Review of Systems  Constitutional: Negative.   Genitourinary: Negative.     Exam:   BP 130/82 (BP Location: Left Arm, Patient Position: Sitting, Cuff Size: Normal)   Pulse 71   Ht 5' 3.5" (1.613 m)   Wt 157 lb 3.2 oz (71.3 kg)   LMP 02/07/1989   BMI 27.41 kg/m   Height: 5' 3.5" (161.3 cm)  General appearance: alert, cooperative and appears stated age Breasts: retracted right breast due to radiation changes which is stable, no masses, no nipple discharge, no LAD; left breast without masses, skin changes, LAD Abdomen: soft, non-tender; bowel sounds normal; no masses,  no organomegaly Lymph nodes: Cervical,  supraclavicular, and axillary nodes normal.  No abnormal inguinal nodes palpated Neurologic: Grossly normal Skin:  inner thigh erythema with satellite lesions  Pelvic: External genitalia:  no lesions              Urethra:  normal appearing urethra with no masses, tenderness or lesions              Bartholins and Skenes: normal                 Vagina: normal appearing vagina with atrophic changes and no discharge, no lesions              Cervix: no lesions              Pap taken: No. Bimanual Exam:  Uterus:  normal size, contour, position, consistency, mobility, non-tender              Adnexa: normal adnexa and no mass, fullness, tenderness               Rectovaginal: Confirms               Anus:  normal sphincter tone, no lesions  Chaperone, Raechel Ache, RN, was present for exam.  Assessment/Plan: 1. Encntr for gyn exam (general) (routine) w/o abn findings - Pap smear not indicated - Mammogram 01/2022 - Colonoscopy 2009 - Bone mineral density 01/2021 - lab work done with PCP, Dr. Orson Aloe - vaccines reviewed/updated  2. Postmenopausal  3. History of breast cancer in female  4. Osteopenia of left hip - has BMD scheduled this fall.  5. Recurrent UTI - is on macrobid 100mg  daily.  Has follow up with Dr. Sherron Monday this fall.   6. Candidal skin infection - nystatin cream (MYCOSTATIN); Apply 1 Application topically 2 (two) times daily. Apply to affected area BID for up to 7 days.  Dispense: 30 g; Refill: 0

## 2022-11-08 DIAGNOSIS — Z23 Encounter for immunization: Secondary | ICD-10-CM | POA: Diagnosis not present

## 2022-12-28 DIAGNOSIS — R350 Frequency of micturition: Secondary | ICD-10-CM | POA: Diagnosis not present

## 2023-01-03 DIAGNOSIS — H401131 Primary open-angle glaucoma, bilateral, mild stage: Secondary | ICD-10-CM | POA: Diagnosis not present

## 2023-01-03 DIAGNOSIS — H524 Presbyopia: Secondary | ICD-10-CM | POA: Diagnosis not present

## 2023-01-17 DIAGNOSIS — J209 Acute bronchitis, unspecified: Secondary | ICD-10-CM | POA: Diagnosis not present

## 2023-01-17 DIAGNOSIS — J029 Acute pharyngitis, unspecified: Secondary | ICD-10-CM | POA: Diagnosis not present

## 2023-01-17 DIAGNOSIS — R051 Acute cough: Secondary | ICD-10-CM | POA: Diagnosis not present

## 2023-01-17 DIAGNOSIS — R0982 Postnasal drip: Secondary | ICD-10-CM | POA: Diagnosis not present

## 2023-01-17 DIAGNOSIS — R058 Other specified cough: Secondary | ICD-10-CM | POA: Diagnosis not present

## 2023-01-17 DIAGNOSIS — Z03818 Encounter for observation for suspected exposure to other biological agents ruled out: Secondary | ICD-10-CM | POA: Diagnosis not present

## 2023-01-26 DIAGNOSIS — Z853 Personal history of malignant neoplasm of breast: Secondary | ICD-10-CM | POA: Diagnosis not present

## 2023-01-26 DIAGNOSIS — Z1231 Encounter for screening mammogram for malignant neoplasm of breast: Secondary | ICD-10-CM | POA: Diagnosis not present

## 2023-01-26 DIAGNOSIS — M8588 Other specified disorders of bone density and structure, other site: Secondary | ICD-10-CM | POA: Diagnosis not present

## 2023-02-10 ENCOUNTER — Encounter (HOSPITAL_BASED_OUTPATIENT_CLINIC_OR_DEPARTMENT_OTHER): Payer: Self-pay | Admitting: *Deleted

## 2023-02-13 ENCOUNTER — Encounter (HOSPITAL_BASED_OUTPATIENT_CLINIC_OR_DEPARTMENT_OTHER): Payer: Self-pay | Admitting: *Deleted

## 2023-02-27 ENCOUNTER — Encounter (HOSPITAL_BASED_OUTPATIENT_CLINIC_OR_DEPARTMENT_OTHER): Payer: Self-pay | Admitting: Obstetrics & Gynecology

## 2023-04-12 DIAGNOSIS — I788 Other diseases of capillaries: Secondary | ICD-10-CM | POA: Diagnosis not present

## 2023-04-12 DIAGNOSIS — D1801 Hemangioma of skin and subcutaneous tissue: Secondary | ICD-10-CM | POA: Diagnosis not present

## 2023-04-12 DIAGNOSIS — L820 Inflamed seborrheic keratosis: Secondary | ICD-10-CM | POA: Diagnosis not present

## 2023-04-12 DIAGNOSIS — L821 Other seborrheic keratosis: Secondary | ICD-10-CM | POA: Diagnosis not present

## 2023-04-12 DIAGNOSIS — L570 Actinic keratosis: Secondary | ICD-10-CM | POA: Diagnosis not present

## 2023-04-20 ENCOUNTER — Ambulatory Visit (HOSPITAL_BASED_OUTPATIENT_CLINIC_OR_DEPARTMENT_OTHER): Admitting: Obstetrics & Gynecology

## 2023-04-20 ENCOUNTER — Encounter (HOSPITAL_BASED_OUTPATIENT_CLINIC_OR_DEPARTMENT_OTHER): Payer: Self-pay | Admitting: Obstetrics & Gynecology

## 2023-04-20 VITALS — BP 150/80 | HR 78 | Ht 63.5 in | Wt 157.2 lb

## 2023-04-20 DIAGNOSIS — Z853 Personal history of malignant neoplasm of breast: Secondary | ICD-10-CM | POA: Diagnosis not present

## 2023-04-20 DIAGNOSIS — R109 Unspecified abdominal pain: Secondary | ICD-10-CM | POA: Diagnosis not present

## 2023-04-20 LAB — CBC WITH DIFFERENTIAL/PLATELET
Basophils Absolute: 0 10*3/uL (ref 0.0–0.2)
Basos: 1 %
EOS (ABSOLUTE): 0.1 10*3/uL (ref 0.0–0.4)
Eos: 2 %
Hematocrit: 41.2 % (ref 34.0–46.6)
Hemoglobin: 13.7 g/dL (ref 11.1–15.9)
Immature Grans (Abs): 0 10*3/uL (ref 0.0–0.1)
Immature Granulocytes: 0 %
Lymphocytes Absolute: 1.4 10*3/uL (ref 0.7–3.1)
Lymphs: 19 %
MCH: 31.1 pg (ref 26.6–33.0)
MCHC: 33.3 g/dL (ref 31.5–35.7)
MCV: 93 fL (ref 79–97)
Monocytes Absolute: 0.5 10*3/uL (ref 0.1–0.9)
Monocytes: 6 %
Neutrophils Absolute: 5.4 10*3/uL (ref 1.4–7.0)
Neutrophils: 72 %
Platelets: 194 10*3/uL (ref 150–450)
RBC: 4.41 x10E6/uL (ref 3.77–5.28)
RDW: 11.5 % — ABNORMAL LOW (ref 11.7–15.4)
WBC: 7.5 10*3/uL (ref 3.4–10.8)

## 2023-04-20 NOTE — Progress Notes (Signed)
 GYNECOLOGY  VISIT  CC:   LLQ pain  HPI: 83 y.o. G0P0000 Married White or Caucasian female here for complaint of pelvic pain/pressure on the left side that started about a week ago.  She hasn't had any bowel or bladder changed.  Denies vaginal bleeding or vaginal discharge.  Denies fever.  Denies seeing blood in her urine.  No recent weight loss.    Last colonoscopy was in 2009.  No follow up recommended. Last MMG:  01/26/2023   Past Medical History:  Diagnosis Date   Breast cancer ([facility]) 2003   right-lumpectomy   Cellulitis of left arm    right arm not left    Femoral hernia 1992   GERD (gastroesophageal reflux disease)    Glaucoma    low pressure   Lymphedema of arm 04/2006   Right arm, Quit arimidex 07/2007   PONV (postoperative nausea and vomiting)    primarily nausea     MEDS:   Current Outpatient Medications on File Prior to Visit  Medication Sig Dispense Refill   atorvastatin (LIPITOR) 10 MG tablet Take 10 mg by mouth daily.      CALCIUM PO Take 650 mg by mouth.     Cholecalciferol (VITAMIN D) 2000 UNITS CAPS Take 2,000 Units by mouth daily.     Coenzyme Q10 (CO Q 10 PO) Take 1 capsule by mouth daily. 100mg      Multiple Vitamins-Minerals (PRESERVISION AREDS 2 PO) Take by mouth daily.     nitrofurantoin (MACRODANTIN) 100 MG capsule Take 1 capsule (100 mg total) by mouth 2 (two) times daily. Treatment completed by 05/10/17     nystatin cream (MYCOSTATIN) Apply 1 Application topically 2 (two) times daily. Apply to affected area BID for up to 7 days. 30 g 0   timolol (BETIMOL) 0.5 % ophthalmic solution Place 1 drop into both eyes 2 times daily.     travoprost, benzalkonium, (TRAVATAN) 0.004 % ophthalmic solution Place 1 drop into both eyes nightly.     No current facility-administered medications on file prior to visit.    ALLERGIES: Tape, Codeine, Meloxicam, Nsaids, Sulfa antibiotics, Sulfasalazine, Tolmetin, and Trimethoprim  SH:  married, non smoker  Review of Systems   Constitutional: Negative.   Gastrointestinal:  Positive for abdominal pain. Negative for blood in stool, constipation, diarrhea, nausea and vomiting.  Genitourinary:  Positive for flank pain and urgency. Negative for dysuria, frequency and hematuria.    PHYSICAL EXAMINATION:    BP (!) 140/82 (BP Location: Left Arm, Patient Position: Sitting, Cuff Size: Large)   Pulse 78   Ht 5' 3.5" (1.613 m)   Wt 157 lb 3.2 oz (71.3 kg)   LMP 02/07/1989   BMI 27.41 kg/m     General appearance: alert, cooperative and appears stated age  Abdomen: soft, non-tender; bowel sounds normal; no masses,  no organomegaly Lymph:  no inguinal LAD noted  Pelvic: External genitalia:  no lesions              Urethra:  normal appearing urethra with no masses, tenderness or lesions              Bartholins and Skenes: normal                 Vagina:  atrophic, no lesions              Cervix: no lesions              Bimanual Exam:  Uterus:  normal size, contour, position,  consistency, mobility, non-tender              Adnexa: no mass, fullness, tenderness  Chaperone not available for exam  Assessment/Plan: 1. Abdominal pain, left lateral (Primary) - exam is non focal today but I know this pt well and she does not complain of much at all and pain complaints are rare.  Feel additional evaluation is warranted.   - Basic metabolic panel - CT ABDOMEN PELVIS W CONTRAST; Future - CBC with Differential/Platelet  2. Personal history of breast cancer

## 2023-04-20 NOTE — Patient Instructions (Signed)
Call [phone] to schedule an appointment at [facility].

## 2023-04-21 ENCOUNTER — Encounter (HOSPITAL_BASED_OUTPATIENT_CLINIC_OR_DEPARTMENT_OTHER): Payer: Self-pay | Admitting: Obstetrics & Gynecology

## 2023-04-21 LAB — BASIC METABOLIC PANEL
BUN/Creatinine Ratio: 18 (ref 12–28)
BUN: 17 mg/dL (ref 8–27)
CO2: 24 mmol/L (ref 20–29)
Calcium: 8.8 mg/dL (ref 8.7–10.3)
Chloride: 105 mmol/L (ref 96–106)
Creatinine, Ser: 0.94 mg/dL (ref 0.57–1.00)
Glucose: 93 mg/dL (ref 70–99)
Potassium: 4.3 mmol/L (ref 3.5–5.2)
Sodium: 142 mmol/L (ref 134–144)
eGFR: 60 mL/min/{1.73_m2} (ref >59)

## 2023-04-23 ENCOUNTER — Other Ambulatory Visit: Payer: Self-pay

## 2023-04-25 ENCOUNTER — Ambulatory Visit (HOSPITAL_BASED_OUTPATIENT_CLINIC_OR_DEPARTMENT_OTHER)
Admission: RE | Admit: 2023-04-25 | Discharge: 2023-04-25 | Disposition: A | Discharge status: Home / Self Care | Source: Ambulatory Visit | Attending: Obstetrics & Gynecology | Admitting: Obstetrics & Gynecology

## 2023-04-25 DIAGNOSIS — K5730 Diverticulosis of large intestine without perforation or abscess without bleeding: Secondary | ICD-10-CM | POA: Diagnosis not present

## 2023-04-25 DIAGNOSIS — R109 Unspecified abdominal pain: Secondary | ICD-10-CM | POA: Diagnosis not present

## 2023-04-25 MED ORDER — IOHEXOL 300 MG/ML  SOLN
100.0000 mL | Freq: Once | INTRAMUSCULAR | Status: AC | PRN
  Administered 2023-04-25: 100 mL via INTRAVENOUS

## 2023-05-01 ENCOUNTER — Encounter (HOSPITAL_BASED_OUTPATIENT_CLINIC_OR_DEPARTMENT_OTHER): Payer: Self-pay | Admitting: Obstetrics & Gynecology

## 2023-05-02 DIAGNOSIS — Z961 Presence of intraocular lens: Secondary | ICD-10-CM | POA: Diagnosis not present

## 2023-05-02 DIAGNOSIS — H401131 Primary open-angle glaucoma, bilateral, mild stage: Secondary | ICD-10-CM | POA: Diagnosis not present

## 2023-06-13 DIAGNOSIS — H01004 Unspecified blepharitis left upper eyelid: Secondary | ICD-10-CM | POA: Diagnosis not present

## 2023-06-13 DIAGNOSIS — H00014 Hordeolum externum left upper eyelid: Secondary | ICD-10-CM | POA: Diagnosis not present

## 2023-08-19 DIAGNOSIS — R0981 Nasal congestion: Secondary | ICD-10-CM | POA: Diagnosis not present

## 2023-08-19 DIAGNOSIS — J069 Acute upper respiratory infection, unspecified: Secondary | ICD-10-CM | POA: Diagnosis not present

## 2023-08-19 DIAGNOSIS — Z03818 Encounter for observation for suspected exposure to other biological agents ruled out: Secondary | ICD-10-CM | POA: Diagnosis not present

## 2023-08-19 DIAGNOSIS — R051 Acute cough: Secondary | ICD-10-CM | POA: Diagnosis not present

## 2023-08-19 DIAGNOSIS — R5383 Other fatigue: Secondary | ICD-10-CM | POA: Diagnosis not present

## 2023-08-23 DIAGNOSIS — R051 Acute cough: Secondary | ICD-10-CM | POA: Diagnosis not present

## 2023-08-23 DIAGNOSIS — J40 Bronchitis, not specified as acute or chronic: Secondary | ICD-10-CM | POA: Diagnosis not present

## 2023-08-29 DIAGNOSIS — Z961 Presence of intraocular lens: Secondary | ICD-10-CM | POA: Diagnosis not present

## 2023-08-29 DIAGNOSIS — H401131 Primary open-angle glaucoma, bilateral, mild stage: Secondary | ICD-10-CM | POA: Diagnosis not present

## 2023-08-29 DIAGNOSIS — H04123 Dry eye syndrome of bilateral lacrimal glands: Secondary | ICD-10-CM | POA: Diagnosis not present

## 2023-11-10 DIAGNOSIS — E663 Overweight: Secondary | ICD-10-CM | POA: Diagnosis not present

## 2023-11-10 DIAGNOSIS — Z79899 Other long term (current) drug therapy: Secondary | ICD-10-CM | POA: Diagnosis not present

## 2023-11-10 DIAGNOSIS — E7800 Pure hypercholesterolemia, unspecified: Secondary | ICD-10-CM | POA: Diagnosis not present

## 2023-11-10 DIAGNOSIS — I890 Lymphedema, not elsewhere classified: Secondary | ICD-10-CM | POA: Diagnosis not present

## 2023-11-10 DIAGNOSIS — Z1389 Encounter for screening for other disorder: Secondary | ICD-10-CM | POA: Diagnosis not present

## 2023-11-10 DIAGNOSIS — K219 Gastro-esophageal reflux disease without esophagitis: Secondary | ICD-10-CM | POA: Diagnosis not present

## 2023-11-10 DIAGNOSIS — J301 Allergic rhinitis due to pollen: Secondary | ICD-10-CM | POA: Diagnosis not present

## 2023-11-10 DIAGNOSIS — Z23 Encounter for immunization: Secondary | ICD-10-CM | POA: Diagnosis not present

## 2023-11-10 DIAGNOSIS — M8580 Other specified disorders of bone density and structure, unspecified site: Secondary | ICD-10-CM | POA: Diagnosis not present

## 2023-11-10 DIAGNOSIS — Z0000 Encounter for general adult medical examination without abnormal findings: Secondary | ICD-10-CM | POA: Diagnosis not present

## 2023-11-10 DIAGNOSIS — R252 Cramp and spasm: Secondary | ICD-10-CM | POA: Diagnosis not present

## 2023-12-28 DIAGNOSIS — R350 Frequency of micturition: Secondary | ICD-10-CM | POA: Diagnosis not present

## 2023-12-28 DIAGNOSIS — N390 Urinary tract infection, site not specified: Secondary | ICD-10-CM | POA: Diagnosis not present

## 2023-12-28 DIAGNOSIS — R351 Nocturia: Secondary | ICD-10-CM | POA: Diagnosis not present

## 2024-01-02 DIAGNOSIS — H401131 Primary open-angle glaucoma, bilateral, mild stage: Secondary | ICD-10-CM | POA: Diagnosis not present

## 2024-01-02 DIAGNOSIS — D3131 Benign neoplasm of right choroid: Secondary | ICD-10-CM | POA: Diagnosis not present

## 2024-01-02 DIAGNOSIS — H524 Presbyopia: Secondary | ICD-10-CM | POA: Diagnosis not present

## 2024-01-02 DIAGNOSIS — H04123 Dry eye syndrome of bilateral lacrimal glands: Secondary | ICD-10-CM | POA: Diagnosis not present

## 2024-02-15 ENCOUNTER — Encounter (HOSPITAL_BASED_OUTPATIENT_CLINIC_OR_DEPARTMENT_OTHER): Payer: Self-pay
